# Patient Record
Sex: Female | Born: 1965 | Race: White | Hispanic: No | Smoking: Never smoker
Health system: Southern US, Academic
[De-identification: ages and names within clinical notes are randomized; demographics above are authoritative.]

## PROBLEM LIST (undated history)

## (undated) DIAGNOSIS — I10 Essential (primary) hypertension: Secondary | ICD-10-CM

## (undated) DIAGNOSIS — G4730 Sleep apnea, unspecified: Secondary | ICD-10-CM

## (undated) DIAGNOSIS — C801 Malignant (primary) neoplasm, unspecified: Secondary | ICD-10-CM

## (undated) DIAGNOSIS — E079 Disorder of thyroid, unspecified: Secondary | ICD-10-CM

## (undated) DIAGNOSIS — H9190 Unspecified hearing loss, unspecified ear: Secondary | ICD-10-CM

## (undated) DIAGNOSIS — D333 Benign neoplasm of cranial nerves: Secondary | ICD-10-CM

## (undated) DIAGNOSIS — J309 Allergic rhinitis, unspecified: Secondary | ICD-10-CM

## (undated) HISTORY — PX: HX TUBAL LIGATION: SHX77

## (undated) HISTORY — DX: Allergic rhinitis, unspecified: J30.9

## (undated) HISTORY — PX: HX OTHER: 2100001105

## (undated) HISTORY — PX: ANKLE SURGERY: SHX546

## (undated) HISTORY — DX: Sleep apnea, unspecified: G47.30

## (undated) HISTORY — DX: Unspecified hearing loss, unspecified ear: H91.90

## (undated) HISTORY — DX: Disorder of thyroid, unspecified: E07.9

## (undated) HISTORY — DX: Malignant (primary) neoplasm, unspecified: C80.1

## (undated) HISTORY — PX: LAPAROSCOPIC CHOLECYSTECTOMY: SUR755

## (undated) HISTORY — PX: SHOULDER SURGERY: SHX246

## (undated) HISTORY — DX: Benign neoplasm of cranial nerves: D33.3

## (undated) HISTORY — PX: HX TONSILLECTOMY: SHX27

## (undated) HISTORY — DX: Essential (primary) hypertension: I10

---

## 1999-10-01 ENCOUNTER — Other Ambulatory Visit (HOSPITAL_COMMUNITY): Payer: Self-pay | Admitting: OBSTETRICS/GYNECOLOGY

## 2009-02-02 ENCOUNTER — Encounter (INDEPENDENT_AMBULATORY_CARE_PROVIDER_SITE_OTHER): Payer: Self-pay | Admitting: Otolaryngology

## 2009-02-02 ENCOUNTER — Ambulatory Visit (INDEPENDENT_AMBULATORY_CARE_PROVIDER_SITE_OTHER): Payer: No Typology Code available for payment source | Admitting: Otolaryngology

## 2009-04-21 ENCOUNTER — Other Ambulatory Visit (INDEPENDENT_AMBULATORY_CARE_PROVIDER_SITE_OTHER): Payer: Self-pay | Admitting: Otolaryngology

## 2010-04-02 ENCOUNTER — Other Ambulatory Visit (INDEPENDENT_AMBULATORY_CARE_PROVIDER_SITE_OTHER): Payer: Self-pay

## 2010-04-02 ENCOUNTER — Encounter (INDEPENDENT_AMBULATORY_CARE_PROVIDER_SITE_OTHER): Payer: Self-pay | Admitting: Otolaryngology

## 2010-04-02 ENCOUNTER — Ambulatory Visit (INDEPENDENT_AMBULATORY_CARE_PROVIDER_SITE_OTHER): Payer: No Typology Code available for payment source | Admitting: Audiologist

## 2010-04-09 ENCOUNTER — Ambulatory Visit (INDEPENDENT_AMBULATORY_CARE_PROVIDER_SITE_OTHER): Payer: No Typology Code available for payment source | Admitting: Audiologist

## 2010-04-09 ENCOUNTER — Encounter (INDEPENDENT_AMBULATORY_CARE_PROVIDER_SITE_OTHER): Payer: Self-pay | Admitting: Otolaryngology

## 2010-04-09 ENCOUNTER — Ambulatory Visit (INDEPENDENT_AMBULATORY_CARE_PROVIDER_SITE_OTHER): Payer: No Typology Code available for payment source | Admitting: Otolaryngology

## 2010-04-09 ENCOUNTER — Ambulatory Visit
Admission: RE | Admit: 2010-04-09 | Discharge: 2010-04-09 | Disposition: A | Discharge status: Home / Self Care | Payer: No Typology Code available for payment source | Attending: Otolaryngology | Admitting: Otolaryngology

## 2010-04-09 MED ORDER — GADOPENTETATE DIMEGLUMINE 469.01 MG/ML (46.9 %) INTRAVENOUS SOLUTION
15.00 mL | INTRAVENOUS | Status: AC
Start: 2010-04-09 — End: 2010-04-09
  Administered 2010-04-09: 12:00:00 15 mL via INTRAVENOUS

## 2010-04-09 NOTE — Progress Notes (Signed)
PATIENT NAME:  Sandra Carlson  MRN:  [id number]  DOB:  July 01, 1966  DATE OF SERVICE: 04/09/2010    HPI:  Sandra Carlson is a 44 y.o. year old female who presents for follow-up for L acoustic neuroma, diagnosed last year. Last seen 01/2009. She has interval increase in tinnitus, and ear infections. She has ear pain with these infections. She complains of minimal unsteadiness. Today she denies fever, HA, sweats, and ear pain.     Physical Exam:  Blood pressure 140/91, pulse 88, temperature 37.3 C (99.1 F), height 1.651 m (5\' 5" ), weight 107.502 kg (237 lb).  Body mass index is 39.44 kg/(m^2).  General Appearance: Pleasant, cooperative, healthy, and in no acute distress.  Eyes: Conjunctivae/corneas clear, PERRLA, EOM's intact.  Head and Face: Normocephalic, atraumatic.  Face symmetric, no obvious lesions.   Pinnae: Normal shape and position.   External auditory canals:  Patent without inflammation.  Tympanic membranes:  Intact, translucent, mobile  Nose:  Mucosa normal  Neck:  No palpable thyroid, salivary gland, or neck masses.  Heme/Lymph:  No cervical adenopathy.  Cardiovascular:  Good perfusion of upper extremities.  No cyanosis of the hands or fingers.  Lungs: No apparent stridorous breathing. No acute distress.  Skin: Skin warm and dry.  Neurologic: Cranial nerves:  grossly intact, alert and oriented x 3, romberg negative, gait intact    Data Reviewed:  Audiogram 04/09/10 - no interval change since 01/2009, left ear 24% word recognition, right ear 100% word recognition  MRI 04/09/10- no interval change since 01/2009, 12.7 mm x  4 mm coronal 12.7 mm x 5 mm axial    Assessment:  1. Acoustic neuroma (225.1V)  MRI IAC W CONTRAST       Plan:  Orders Placed This Encounter   . MRI IAC W CONTRAST     Return in one year, MRI prior to return visit    Josefine Class, MD 04/09/2010, 2:36 PM     Gifford Shave, MD

## 2010-04-09 NOTE — Progress Notes (Signed)
AUDIOGRAM  44 y.o. / F, audio f/u per AN L observation;  Normal hearing, AD;  Normal hearing dropping sharply to severe SNHL above 1000 Hz, AS, resulting in a significant asymmetry;   Type A normal tymps, AU, consistent with normal middle ear function bilaterally;   Rec: ENT f/u, audio prn;      KJK

## 2010-04-23 ENCOUNTER — Encounter (INDEPENDENT_AMBULATORY_CARE_PROVIDER_SITE_OTHER): Payer: Self-pay | Admitting: Otolaryngology

## 2010-04-23 ENCOUNTER — Ambulatory Visit (INDEPENDENT_AMBULATORY_CARE_PROVIDER_SITE_OTHER): Payer: No Typology Code available for payment source | Admitting: Audiologist

## 2010-04-23 ENCOUNTER — Other Ambulatory Visit (INDEPENDENT_AMBULATORY_CARE_PROVIDER_SITE_OTHER): Payer: Self-pay

## 2011-04-08 ENCOUNTER — Ambulatory Visit (INDEPENDENT_AMBULATORY_CARE_PROVIDER_SITE_OTHER): Payer: No Typology Code available for payment source | Admitting: Otolaryngology

## 2011-04-08 ENCOUNTER — Ambulatory Visit (HOSPITAL_BASED_OUTPATIENT_CLINIC_OR_DEPARTMENT_OTHER): Payer: No Typology Code available for payment source | Admitting: Audiologist

## 2011-04-08 ENCOUNTER — Ambulatory Visit
Admission: RE | Admit: 2011-04-08 | Discharge: 2011-04-08 | Disposition: A | Discharge status: Home / Self Care | Payer: No Typology Code available for payment source | Source: Ambulatory Visit | Attending: Otolaryngology | Admitting: Otolaryngology

## 2011-04-08 VITALS — BP 120/74 | HR 65 | Temp 97.5°F | Ht 65.0 in | Wt 240.0 lb

## 2011-04-08 DIAGNOSIS — D333 Benign neoplasm of cranial nerves: Secondary | ICD-10-CM | POA: Insufficient documentation

## 2011-04-08 DIAGNOSIS — H905 Unspecified sensorineural hearing loss: Secondary | ICD-10-CM | POA: Insufficient documentation

## 2011-04-08 MED ORDER — GADOPENTETATE DIMEGLUMINE 469.01 MG/ML (46.9 %) INTRAVENOUS SOLUTION
20.00 mL | INTRAVENOUS | Status: AC
Start: 2011-04-08 — End: 2011-04-08
  Administered 2011-04-08: 14:00:00 20 mL via INTRAVENOUS

## 2011-04-08 NOTE — Progress Notes (Signed)
AUDIOGRAM  45 y.o. / F, f/ua duio acoustic neuroma L;  Normal hearing, AD; Slight dropping sharply to severe SNHL, AS w/ poor dscrim;  Type A normal tymps, AU, consistent with normal middle ear function bilaterally;   Rec: ENT f/u, audio prn;              KJK

## 2011-04-09 NOTE — Progress Notes (Addendum)
[facility]                              DEPARTMENT OF [place], [place] [address]                                PATIENT NAME: Sandra Carlson, Sandra Carlson ONGEXB:[id number]  DATE OF SERVICE:04/08/2011  DATE OF BIRTH: Dec 07, 1965    PROGRESS NOTE    CHIEF COMPLAINT:  Followup from April 07, 2010, for acoustic neuroma.     SUBJECTIVE:  This is a 45 year old female who returns today.  She has a history of left-sided acoustic neuroma diagnosed in 2010.  She had an MRI last year on April 09, 2010.  We measured her tumor to be 12.7 x 4 mm in coronal 12.7 x 5 mm on the axial MRI.  Today, she says she has no useful hearing in the left ear.  No significant balance issues since last year.  No significant issues with ear infections.    OBJECTIVE:  A 45 year old female in no acute distress.  She is 5 feet 5 inches tall and 240 pounds.  Temperature 97.5 degrees Fahrenheit, pulse 65, blood pressure 120/74.  General:  Pleasant, cooperative, in no distress.  Cardiovascular:  No cyanosis appreciated in the upper extremities.  Respiratory:  No respiratory distress, no apparent stridor.  Neurologic:  Romberg and tandem gait are both stable.  Ears:  Healthy tympanic membranes bilaterally.  Nose:  Moist mucosa, no epistaxis.  Oral cavity:  No concerns of the lips, tongue, gums or floor of mouth.  Oropharynx:  No concerning mass, asymmetry or lesion.  Neck:  No palpable lymphadenopathy.    REVIEW OF INFORMATION:  MRI from the internal auditory canals with and without contrast was reviewed today.  The tumor appears to have gotten slightly smaller in size.  It is a left intracanalicular tumor measuring 8 x 4 mm of coronal and 7 x 7 mm on axial.  Dr. Everlena Cooper reviewed these agreed with these measurements.     Audiogram today shows right ear hearing to be within normal limits with 100% word recognition and type A tympanogram.  The left ear shows hearing dropping from very slight hearing loss to perform wound hearing loss very sharply.  Word recognition is only 12% on this left ear.  Type A tympanogram on the left.    ASSESSMENT:  A 45 year old female with left acoustic neuroma that is slightly decreased in size on MRI and is intracanalicular.    PLAN:  We will have her follow up in 2 years with an MRI of the internal auditory canals with and without contrast.  We will not repeat the audiogram at that time as she has no serviceable hearing on the left side already.      Marnee Guarneri, MD  Resident  [place] [facility]    Gifford Shave, MD  Professor and [facility]    NU/[id number]; D: 04/08/2011 15:00:08; T: 04/09/2011 12:35:14    cc: Sherilyn Dacosta MD      [address]       [place], [place] [address]  I saw and examined the patient.  I reviewed the resident's note.  I agree with the findings and plan of care as documented in the resident's note.  Any exceptions/additions are edited/noted.    Gifford Shave, MD 04/10/2011, 9:44 AM

## 2012-09-08 ENCOUNTER — Ambulatory Visit (INDEPENDENT_AMBULATORY_CARE_PROVIDER_SITE_OTHER): Payer: Self-pay | Admitting: Otolaryngology

## 2012-09-08 NOTE — Telephone Encounter (Signed)
Message copied by Noah Delaine on Tue Sep 08, 2012  9:06 AM  ------       Message from: Debera Lat       Created: Tue Sep 08, 2012  8:54 AM         >> Debera Lat 09/08/2012 08:54 AM       Dr. Everlena Cooper pt//////////////pt states she is to be scheduled for mri and audio visit the same day she comes to see you, I have scheduled apt for 08.14.14 at 2pm please call and advise. Thank you  ------

## 2012-09-09 NOTE — Telephone Encounter (Signed)
I called and left a message stating that the patient is scheduled for appointments on 04/08/13 MRI @ 11:00 AM, Audio @ 1:00 PM, and the appointment with Everlena Cooper is at 2:00PM.

## 2013-04-08 ENCOUNTER — Encounter (INDEPENDENT_AMBULATORY_CARE_PROVIDER_SITE_OTHER): Payer: No Typology Code available for payment source | Admitting: Otolaryngology

## 2013-04-08 ENCOUNTER — Ambulatory Visit (INDEPENDENT_AMBULATORY_CARE_PROVIDER_SITE_OTHER): Payer: No Typology Code available for payment source | Admitting: Audiologist

## 2013-04-08 ENCOUNTER — Other Ambulatory Visit (INDEPENDENT_AMBULATORY_CARE_PROVIDER_SITE_OTHER): Payer: Self-pay

## 2013-04-22 ENCOUNTER — Ambulatory Visit (INDEPENDENT_AMBULATORY_CARE_PROVIDER_SITE_OTHER): Payer: No Typology Code available for payment source | Admitting: Otolaryngology-Audiology

## 2013-04-22 ENCOUNTER — Ambulatory Visit (INDEPENDENT_AMBULATORY_CARE_PROVIDER_SITE_OTHER): Payer: No Typology Code available for payment source | Admitting: Otolaryngology

## 2013-04-22 ENCOUNTER — Encounter (INDEPENDENT_AMBULATORY_CARE_PROVIDER_SITE_OTHER): Payer: Self-pay | Admitting: Otolaryngology

## 2013-04-22 ENCOUNTER — Ambulatory Visit
Admission: RE | Admit: 2013-04-22 | Discharge: 2013-04-22 | Disposition: A | Discharge status: Home / Self Care | Payer: No Typology Code available for payment source | Source: Ambulatory Visit | Attending: Otolaryngology | Admitting: Otolaryngology

## 2013-04-22 VITALS — BP 124/76 | HR 70 | Temp 97.1°F | Ht 65.75 in | Wt 241.0 lb

## 2013-04-22 DIAGNOSIS — H905 Unspecified sensorineural hearing loss: Secondary | ICD-10-CM | POA: Insufficient documentation

## 2013-04-22 MED ADMIN — gadopentetate dimeglumine 10 mmol/20 mL(469.01 mg/mL) intravenous soln: 20 mL | INTRAVENOUS | @ 13:00:00 | NDC 50419018802

## 2013-04-22 NOTE — Progress Notes (Addendum)
PATIENT NAME:  Sandra Carlson  MRN:  [id number]  DOB:  April 06, 1966  DATE OF SERVICE: 04/22/2013    HPI:  Sandra Carlson is a 47 y.o. year old female with history of left acoustic neuroma diagnosed in 2010.  She is here today for 2 years follow up.  She states that she has had no worsening of her hearing.  She states she has had no balance issues.  She states two years ago she had stable MRI.  She has no other complaints today.    Physical Exam:  Blood pressure 124/76, pulse 70, temperature 36.2 C (97.1 F), height 1.67 m (5' 5.75"), weight 109.3 kg (240 lb 15.4 oz), SpO2 97.00%.  Body mass index is 39.19 kg/(m^2).  General Appearance: Pleasant, cooperative, healthy, and in no acute distress.  Eyes: Conjunctivae/corneas clear, PERRLA, EOM's intact.  Head and Face: Normocephalic, atraumatic.  Face symmetric, no obvious lesions.   Pinnae: Normal shape and position.   External auditory canals:  Patent without inflammation.  Tympanic membranes:  Intact, translucent, midposition, middle ear aerated.  Nose:  External pyramid midline. Septum midline. Mucosa normal. No purulence, polyps, or crusts.   Oral Cavity/Oropharynx: No mucosal lesions, masses, or pharyngeal asymmetry.  Hypopharynx/Larynx: Indirect mirror laryngoscopy revealed no hypopharyngeal or laryngeal masses or lesions, normal laryngeal mobility, and voice normal.  Neck:  No palpable thyroid, salivary gland, or neck masses.  Heme/Lymph:  No cervical adenopathy.  Cardiovascular:  Good perfusion of upper extremities.  No cyanosis of the hands or fingers.  Lungs: No apparent stridorous breathing. No acute distress.  Skin: Skin warm and dry.  Neurologic: Cranial nerves:  grossly intact.  Psychiatric:  Alert and oriented x 3.    Audiogram   47y.o.f. followed by Dr. Everlena Cooper. Pt has hx of left acoustic neuroma, which was diagnosed in 2010. She continues to have serial MRI, audiogram and clinic visit with Dr. Everlena Cooper. She has no complaints today except that she cannot understand anything out of her left ear, rather she has only some sound awareness. IMP: Normal, Type A tymp right. Type Ad (hypermobile) tymp left. SRTs at 0dB right and 45dB left. WR scores are 100% right and 16% left. Responses to puretones show hearing WNL in the right ear. There is a slight to profound to severe SNHL in the left ear. No significant change in hearing since the last audiogram of 04/08/2011. REC: f/u with Dr. Everlena Cooper. Repeat audio PRN  Assessment:  Left Acoustic neuroma, appears smaller on MRI when compared to 2011, measurements today are 7x3 mm    Plan:  Discussed with patient that Acoustic neuroma appears smaller than scan from 2011.  We will plan to see her back in 2 years with MRI IAC same day or sooner if needed.    Tawni Carnes PA-C  Physician Assistant  [place] [facility]    Pt seen in clinic with Dr. Everlena Cooper     PCP:  Sherilyn Dacosta, MD  [address]  [place] [address]   REF:  Self, Referral  No address on file           I personally saw and examined the patient. See physician's assistant note for additional details. My findings are MRI scan showing no change in tumor in past 2 yrs with decrease in tumor compared with 3 yrs ago.    Gifford Shave, MD

## 2013-04-22 NOTE — Progress Notes (Signed)
AUDIOGRAM:    47y.o.f. followed by Dr. Everlena Cooper. Pt has hx of left acoustic  neuroma, which was diagnosed in 2010.  She continues to have serial MRI, audiogram and clinic visit with Dr. Everlena Cooper.  She has no complaints today except that she cannot understand anything out of her left ear, rather she has only some sound awareness.  IMP: Normal, Type A tymp right. Type Ad (hypermobile) tymp left.  SRTs at 0dB right and 45dB left.  WR scores are 100% right and 16% left.  Responses to puretones show hearing WNL in the right ear.  There is a slight to profound to severe SNHL in the left ear.   No significant change in hearing since the last audiogram of 04/08/2011.  REC: f/u with Dr. Everlena Cooper.  Repeat audio PRN.  Knox Saliva, AuD

## 2014-09-08 ENCOUNTER — Ambulatory Visit (INDEPENDENT_AMBULATORY_CARE_PROVIDER_SITE_OTHER): Payer: Self-pay | Admitting: Otolaryngology

## 2014-09-08 DIAGNOSIS — D333 Benign neoplasm of cranial nerves: Secondary | ICD-10-CM

## 2014-09-08 NOTE — Telephone Encounter (Signed)
Order placed. Patient will be contacted regarding scheduling.

## 2014-09-08 NOTE — Telephone Encounter (Signed)
-----   Message from Leitha Bleak sent at 09/08/2014  8:53 AM EST -----  >> STEPHANIE GAMBLE 09/08/2014 08:53 AM  Dr Romona Curls states that she is due in July for an audio and mri and f/u with you she would like to know if you could order these and schedule them on the same day and states that afternoon is better for her since she drives so far please call

## 2015-03-16 ENCOUNTER — Ambulatory Visit
Admission: RE | Admit: 2015-03-16 | Discharge: 2015-03-16 | Disposition: A | Discharge status: Home / Self Care | Payer: No Typology Code available for payment source | Source: Ambulatory Visit | Attending: Otolaryngology | Admitting: Otolaryngology

## 2015-03-16 ENCOUNTER — Ambulatory Visit (HOSPITAL_BASED_OUTPATIENT_CLINIC_OR_DEPARTMENT_OTHER): Payer: No Typology Code available for payment source | Admitting: Otolaryngology

## 2015-03-16 ENCOUNTER — Ambulatory Visit (INDEPENDENT_AMBULATORY_CARE_PROVIDER_SITE_OTHER): Payer: No Typology Code available for payment source | Admitting: Audiologist

## 2015-03-16 VITALS — BP 132/86 | HR 79 | Temp 97.7°F | Ht 65.75 in | Wt 252.4 lb

## 2015-03-16 DIAGNOSIS — D333 Benign neoplasm of cranial nerves: Secondary | ICD-10-CM

## 2015-03-16 DIAGNOSIS — H9192 Unspecified hearing loss, left ear: Secondary | ICD-10-CM | POA: Insufficient documentation

## 2015-03-16 LAB — PERFORM POC ISTAT CREATININE POINT OF CARE: CREATININE, POC: 0.8 mg/dL (ref 0.49–1.10)

## 2015-03-16 MED ORDER — GADOBENATE DIMEGLUMINE 529 MG/ML(0.1 MMOL/0.2 ML) INTRAVENOUS SOLUTION
20.00 mL | INTRAVENOUS | Status: AC
Start: 2015-03-16 — End: 2015-03-16
  Administered 2015-03-16: 13:00:00 20 mL via INTRAVENOUS

## 2015-03-16 NOTE — Progress Notes (Signed)
AUDIOGRAM: 49 y.o. female referred by Dr. Fonnie Mu for an audiogram.  Patient has a history of a left AN.  Impression: Type A tympanogram with normal middle ear pressure and compliance bilaterally.  Hearing is WNL, with excellent WR (100%) in the right ear.  Hearing is WNL to a profound SNHL, with no measurable word understanding in the left ear.  Recommendations: Continue medical management.  Repeat audiogram as needed. Lydia Guiles

## 2015-03-16 NOTE — Progress Notes (Signed)
PATIENT NAME:  Sandra Carlson  MRN:  [id number]  DOB:  [birth year]  DATE OF SERVICE: 03/16/2015    HPI:  Sandra Carlson is a 49 y.o. year old female who presents for follow-up  of a left-sided acoustic neuroma that we have been following since 2010, without much change in size.  In fact, the tumor may even be smaller than it was 5 years ago.  Her main symptom is that of profound hearing loss with occasional tinnitus.  No dizziness.          Physical Exam:  Blood pressure 132/86, pulse 79, temperature 36.5 C (97.7 F), temperature source Thermal Scan, height 1.67 m (5' 5.75"), weight 114.5 kg (252 lb 6.8 oz).  Body mass index is 41.06 kg/(m^2).  General Appearance: Pleasant, cooperative, healthy, and in no acute distress.  Eyes: Conjunctivae/corneas clear, PERRLA, EOM's intact.  Head and Face: Normocephalic, atraumatic.  Face symmetric, no obvious lesions.   Right Ear   Pinna: Normal shape and position.    External auditory canal:  Patent without inflammation.   Tympanic membrane:  Intact, translucent, midposition, middle ear aerated.  Left Ear   Pinna: Normal shape and position.    External auditory canal:  Patent without inflammation.   Tympanic membrane:  Intact, translucent, midposition, middle ear aerated.  Neurologic: Cranial nerves:  grossly intact. Romberg, gait, and tandem gait are normal.      Procedure:  No notes on file    Data Reviewed:  MRI with gadolinium performed today.  It has not yet been read.  I measure the tumor as being approximately 5 x 10 mm in size and still intracanalicular.  The tumor measured 5.6 x 10.6 in one dimension and 5.6 x 11 mm in another dimension in 2014.        Assessment:  1. Acoustic neuroma, intracanalicular AS, unchanged in size over last several years        Plan:  No orders of the defined types were placed in this encounter.     RTC 2 yrs with MRI with gad    Judith Part. Fonnie Mu, MD, MBA  Romeo and Jodelle Red Professor   [place] [facility] Ot[facility]    PCP: Jodean Lima,  MD  [place] [address]  REF: Eldred Manges, MD  [address]  [address]  [place], [place] [address]

## 2015-03-16 NOTE — Procedures (Signed)
AUDIOGRAM: 49 y.o. female referred by Dr. Fonnie Mu for an audiogram.  Patient has a history of a left AN.  Impression: Type A tympanogram with normal middle ear pressure and compliance bilaterally.  Hearing is WNL, with excellent WR (100%) in the right ear.  Hearing is WNL to a profound SNHL, with no measurable word understanding in the left ear.  Recommendations: Continue medical management.  Repeat audiogram as needed. Lydia Guiles

## 2016-09-20 ENCOUNTER — Ambulatory Visit (INDEPENDENT_AMBULATORY_CARE_PROVIDER_SITE_OTHER): Payer: Self-pay | Admitting: Otolaryngology

## 2016-09-20 DIAGNOSIS — D333 Benign neoplasm of cranial nerves: Secondary | ICD-10-CM

## 2016-09-20 NOTE — Telephone Encounter (Signed)
Regarding: Eldred Manges, MD  ----- Message from Shirl Harris sent at 09/19/2016  1:58 PM EST -----  Pt call to schedule appt for Return in 2 years for MRI, Audio, and visit with Dr. Fonnie Mu. I have MRI set for 02/18/17 @ 11:30am, appt with Dr. Fonnie Mu on 02/18/17 @ 1:00pm ST[facility] we also need to schedule audio. Can you have a new order placed in the chart. Please follow up with pt.    Thank you.

## 2016-09-20 NOTE — Telephone Encounter (Signed)
Pt will be returning to see Dr Fonnie Mu in June and needed an order placed to have the audio done.

## 2017-02-18 ENCOUNTER — Ambulatory Visit (INDEPENDENT_AMBULATORY_CARE_PROVIDER_SITE_OTHER): Payer: No Typology Code available for payment source | Admitting: Otolaryngology

## 2017-02-18 ENCOUNTER — Ambulatory Visit (INDEPENDENT_AMBULATORY_CARE_PROVIDER_SITE_OTHER): Payer: No Typology Code available for payment source | Admitting: Audiologist

## 2017-02-18 ENCOUNTER — Ambulatory Visit
Admission: RE | Admit: 2017-02-18 | Discharge: 2017-02-18 | Disposition: A | Discharge status: Home / Self Care | Payer: No Typology Code available for payment source | Source: Ambulatory Visit | Attending: Otolaryngology | Admitting: Otolaryngology

## 2017-02-18 VITALS — BP 126/100 | HR 98 | Temp 97.5°F | Ht 65.75 in | Wt 250.0 lb

## 2017-02-18 DIAGNOSIS — D333 Benign neoplasm of cranial nerves: Secondary | ICD-10-CM

## 2017-02-18 MED ORDER — GADOBENATE DIMEGLUMINE 529 MG/ML(0.1 MMOL/0.2 ML) INTRAVENOUS SOLUTION
20.00 mL | INTRAVENOUS | Status: AC
Start: 2017-02-18 — End: 2017-02-18
  Administered 2017-02-18: 12:00:00 20 mL via INTRAVENOUS

## 2017-02-18 NOTE — Procedures (Signed)
AUDIOGRAM  39 F, f/u L SNHL per AN;    Normal to slight high freq SNHL, AD;  Slight dropping to profound SNHL, AS with minimum discrim, stable;  Type A normal tymp, AU;     Rec: ENT f/u, audio prn;        KJK

## 2017-02-18 NOTE — Progress Notes (Signed)
PATIENT NAME:  Sandra Carlson  MRN:  [id number]  DOB:  1966-04-12  DATE OF SERVICE: 02/18/2017    HPI:  Sandra Carlson is a 51 y.o. year old female who I first saw in 2010 with a intracanalicular left sided acoustic neuroma.  Her first symptom was profound hearing loss with occasional tinnitus, and that remains her only symptom.  She denies dizziness.  She was last imaged 2 years ago, at which time the lesion measured 5 x 10 mm in the left IAC.    Physical Exam:  Blood pressure (!) 126/100, pulse 98, temperature 36.4 C (97.5 F), height 1.67 m (5' 5.75"), weight 113.4 kg (250 lb), SpO2 99 %.  Body mass index is 40.66 kg/(m^2).  General Appearance: Pleasant, cooperative, healthy, and in no acute distress.  Eyes: Conjunctivae/corneas clear, PERRLA, EOM's intact.  Head and Face: Normocephalic, atraumatic.  Face symmetric, no obvious lesions.   Right Ear   Pinna: Normal shape and position.    External auditory canal:  Patent without inflammation.   Tympanic membrane:  Intact, translucent, midposition, middle ear aerated.  Left Ear   Pinna: Normal shape and position.    External auditory canal:  Patent without inflammation.   Tympanic membrane:  Intact, translucent, midposition, middle ear aerated.    Procedure:  No notes on file    Data Reviewed:  Audiogram shows a SRT of 70 dB on the left with 12% word recognition.  This is similar to the audiogram from 03/16/15.      MRI scan performed today has not yet been read.  The gadolinium enhanced image shows a left side intracanalicular tumor measuring 5 x 10 mm.      Assessment:    (1) Left intracanalicular acoustic neuroma, unchanged in size for last 8 years.     Plan:    (1) I gave her Dr. Landis Gandy' name and suggested she return in 3 years with another MRI scan with gadolinium.  I doubt she will need surgery in the future.      Eldred Manges, MD          SDR  06.27.18  Copy to: Dr. Rubin Payor

## 2017-02-18 NOTE — Progress Notes (Signed)
AUDIOGRAM  14 F, f/u L SNHL per AN;    Normal to slight high freq SNHL, AD;  Slight dropping to profound SNHL, AS with minimum discrim, stable;  Type A normal tymp, AU;     Rec: ENT f/u, audio prn;        KJK

## 2017-02-19 ENCOUNTER — Encounter (INDEPENDENT_AMBULATORY_CARE_PROVIDER_SITE_OTHER): Payer: Self-pay | Admitting: Otolaryngology

## 2021-08-18 NOTE — Progress Notes (Signed)
 CARE TEAM:  Patient Care Team:  Vira Agar, DO as PCP - General (Family Medicine)    ASSESSMENT  1. Morton's neuroma of right foot    2. Metatarsalgia of right foot    3. Bunion, right foot       There is no problem list on file for this patient.    Impression: After discussion of symptoms with the patient and physical examination, it is under my professional opinion that the patient has right foot Morton's Neuroma, bunion deformity, and metatarsalgia.     PLAN  Treatment Plan:   Orders Placed This Encounter   . Other Ortho Injection: R 2nd interspace   . BP Patient Education    Follow-up: Return in about 6 weeks (around 10/03/2021), or if symptoms worsen or fail to improve.    The nature of the patient's condition and treatment options were discussed as described below.      Intraarticular steroid injection: I discussed with the patient that this involves the injection of a potent anti-inflammatory (cortisone) into the joint. Risks include whitening of the skin at the injection site and a transient rise in blood glucose. Complications are extremely rare; infection is the most common. There is no rule as to how many injections can be given. Because some research shows that too much cortisone can damage cartilage, most physicians limit how many injections they will give you.  Potential for future surgery: I explained that though I am not recommending a surgical intervention at this time, this may be recommended or necessary in the future to alleviate or treat this condition, especially if conservative measures fail or the condition continues to progress or worsen. The patient understands the risks and benefits and wishes to proceed with a right foot 2nd interspace steroid injection. The injection was performed today in office without complication.    MRI of the right foot was independently reviewed and discussed with the patient.  I have discussed with the patient that she has symptoms consistent with  metatarsalgia, concern for Morton's neuroma given metatarsal head tenderness with intermittent numbness and tingling, and bunion deformity. Discussed treatment options including orthotic with metatarsal recess, possible steroid injection therapy, Voltaren Gel, and wide toed shoes. Patient elected to proceed with a right foot morton's neuroma 2nd interspace steroid injection and tolerated the procedure well. Offered to provide an order for orthotics, however patient wants to wait until after the steroid injection. Patient elected to proceed with She will follow up 4-6 weeks.     HISTORY OF PRESENT ILLNESS  Chief Complaint: Pain and Swelling of the Right Foot (Pt presents today for rt foot pain, has x-ray and MRI.)     Age: 55 y.o.  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 Illescas is a 55 y.o. female  who presents today for evaluation of right foot pain.      Today, patient reports pain of the right foot with no inciting injury or event. Patient localizes pain to the plantar aspect of the right foot, with swelling. She then had x-rays performed with no specific findings and she states her MRI demonstrated possible bone bruise, possible fracture, and tendonitis. Patient indicates she has done a lot of damage to the right foot. Patient states she sometimes has significant swelling and tightness that causes some numbness and tingling symptoms of the right foot. She has tried an OTC orthotic with worsening symptoms. Patient indicates pain  is exacerbated with wearing shoes.         OBJECTIVE  Constitutional:  No acute distress. Her body mass index is 45.16 kg/m.   Eyes:  Sclera are nonicteric.  Respiratory:  No labored breathing.  Cardiovascular:  No marked edema.  Skin:  No marked skin ulcers.  Neurological:  No marked sensory loss noted.  Psychiatric: Alert and oriented x3.    Musculoskeletal     Right Foot ROM:   0 degrees dorsiflexion   Right Great Toe MTP ROM:  Limited  Right Great Toe IP ROM: Normal   Skin:  Right Foot: skin intact, no rashes or lesions.   Inspection:  Heel corrects to varus on heel rise  NTTP over posterior tibia  NTTP over plantar fascia  Pain and tenderness between 1st and 2nd metatarsal heads  Slight bunion deformity  Right Foot Eversion: Strength: 5/5, normal muscle tone.   Inversion: Strength: 5/5, normal muscle tone.   Stability:  Right Foot: Stable   Special:  Right Foot: Normal           IMAGING / STUDIES           Xrays of the right foot were taken at [facility] on 08/01/21 and independently reviewed by me today    Impression:  No obvious fracture identified. Normal alignment. Normal joint space. Normal bony architecture. No lytic or blastic lesions. No significant soft tissue swelling or effusion. Skeletally mature.      An MRI of the right foot was obtained at an external facility on 07/15/21 and independently reviewed by me today.     Impression: dorsal soft tissue edema. Otherwise normal. No obvious fracture identified.         PROCEDURES  Other Ortho Injection: R 2nd interspace    Date/Time: 08/22/2021 1:35 PM  Performed by: Terri Piedra, DO  Authorized by: Terri Piedra, DO     Procedure Details:  Procedure: Morton's Neuroma  Consent Given by:  Patient  Site marked: the procedure site was marked    Timeout: prior to procedure the correct patient, procedure, and site was verified    Indications:  Pain    Site:  R 2nd interspace        Medication Details:  Needle Size:  25    Preparation: Patient was prepped and draped in the usual sterile fashion.  Medications administered: 6 mg Betamethasone 6 (3-3) MG/ML; 2 mL bupivacaine 0.25 %    Patient education: Cortisone Flare Risk Discussed, Patient Education Distributed, Skin or Facial Flushing Discussed and Changes in Pigmentation Discussed  Patient tolerance: patient tolerated the procedure well with no immediate complications      I, Terri Piedra, DO, personally, performed the services described in  this documentation, as scribed in my presence, and it is both accurate and complete.  Scribed by: Lysbeth Penner

## 2021-11-29 ENCOUNTER — Other Ambulatory Visit: Payer: Self-pay

## 2021-11-29 ENCOUNTER — Other Ambulatory Visit: Payer: 59 | Attending: FAMILY PRACTICE

## 2021-11-29 DIAGNOSIS — K219 Gastro-esophageal reflux disease without esophagitis: Secondary | ICD-10-CM | POA: Insufficient documentation

## 2021-11-29 DIAGNOSIS — F411 Generalized anxiety disorder: Secondary | ICD-10-CM | POA: Insufficient documentation

## 2021-11-29 DIAGNOSIS — D509 Iron deficiency anemia, unspecified: Secondary | ICD-10-CM | POA: Insufficient documentation

## 2021-11-29 DIAGNOSIS — E039 Hypothyroidism, unspecified: Secondary | ICD-10-CM | POA: Insufficient documentation

## 2021-11-29 DIAGNOSIS — I10 Essential (primary) hypertension: Secondary | ICD-10-CM | POA: Insufficient documentation

## 2021-11-29 LAB — COMPREHENSIVE METABOLIC PANEL, NON-FASTING
ALBUMIN/GLOBULIN RATIO: 1.4 (ref 0.8–1.4)
ALBUMIN: 4.1 g/dL (ref 3.5–5.7)
ALKALINE PHOSPHATASE: 83 U/L (ref 34–104)
ALT (SGPT): 29 U/L (ref 7–52)
ANION GAP: 6 mmol/L — ABNORMAL LOW (ref 10–20)
AST (SGOT): 25 U/L (ref 13–39)
BILIRUBIN TOTAL: 0.6 mg/dL (ref 0.3–1.2)
BUN/CREA RATIO: 20 (ref 6–22)
BUN: 19 mg/dL (ref 7–25)
CALCIUM, CORRECTED: 9 mg/dL (ref 8.9–10.8)
CALCIUM: 9.1 mg/dL (ref 8.6–10.3)
CHLORIDE: 108 mmol/L — ABNORMAL HIGH (ref 98–107)
CO2 TOTAL: 26 mmol/L (ref 21–31)
CREATININE: 0.97 mg/dL (ref 0.60–1.30)
ESTIMATED GFR: 69 mL/min/{1.73_m2} (ref >59)
GLOBULIN: 2.9 (ref 2.9–5.4)
GLUCOSE: 94 mg/dL (ref 74–109)
OSMOLALITY, CALCULATED: 282 mOsm/kg (ref 270–290)
POTASSIUM: 4.2 mmol/L (ref 3.5–5.1)
PROTEIN TOTAL: 7 g/dL (ref 6.4–8.9)
SODIUM: 140 mmol/L (ref 136–145)

## 2021-11-29 LAB — LIPID PANEL
CHOL/HDL RATIO: 3.8
CHOLESTEROL: 180 mg/dL (ref 136–290)
HDL CHOL: 47 mg/dL (ref 23–92)
LDL CALC: 107 mg/dL — ABNORMAL HIGH (ref 0–100)
TRIGLYCERIDES: 128 mg/dL (ref <=150)
VLDL CALC: 26 mg/dL (ref 0–50)

## 2021-11-29 LAB — CBC WITH DIFF
BASOPHIL #: 0.1 10*3/uL (ref 0.00–2.50)
BASOPHIL %: 1 % (ref 0–3)
EOSINOPHIL #: 0.2 10*3/uL (ref 0.00–2.40)
EOSINOPHIL %: 4 % (ref 0–7)
HCT: 44.1 % (ref 37.0–47.0)
HGB: 15.4 g/dL (ref 12.5–16.0)
LYMPHOCYTE #: 1.5 10*3/uL — ABNORMAL LOW (ref 2.10–11.00)
LYMPHOCYTE %: 27 % (ref 25–45)
MCH: 29.8 pg (ref 27.0–32.0)
MCHC: 35 g/dL (ref 32.0–36.0)
MCV: 85.1 fL (ref 78.0–99.0)
MONOCYTE #: 0.4 10*3/uL (ref 0.00–4.10)
MONOCYTE %: 7 % (ref 0–12)
MPV: 8.4 fL (ref 7.4–10.4)
NEUTROPHIL #: 3.5 10*3/uL — ABNORMAL LOW (ref 4.10–29.00)
NEUTROPHIL %: 61 % (ref 40–76)
PLATELETS: 214 10*3/uL (ref 140–440)
RBC: 5.19 10*6/uL (ref 4.20–5.40)
RDW: 13.8 % (ref 11.6–14.8)
WBC: 5.7 10*3/uL (ref 4.0–10.5)
WBCS UNCORRECTED: 5.7 10*3/uL

## 2021-11-29 LAB — THYROID STIMULATING HORMONE (SENSITIVE TSH): TSH: 2.903 u[IU]/mL (ref 0.450–5.330)

## 2021-11-29 LAB — IRON TRANSFERRIN AND TIBC
IRON (TRANSFERRIN) SATURATION: 50 % (ref 15–50)
IRON: 147 ug/dL (ref 50–212)
TOTAL IRON BINDING CAPACITY: 293 ug/dL (ref 250–450)
TRANSFERRIN: 209 mg/dL (ref 203–362)
UIBC: 146 ug/dL (ref 130–375)

## 2021-11-29 LAB — HGA1C (HEMOGLOBIN A1C WITH EST AVG GLUCOSE): HEMOGLOBIN A1C: 5.5 % (ref 4.0–6.0)

## 2022-01-28 ENCOUNTER — Encounter (INDEPENDENT_AMBULATORY_CARE_PROVIDER_SITE_OTHER): Payer: Self-pay | Admitting: OTOLARYNGOLOGY

## 2022-01-29 IMAGING — MR MRI BRAIN W/CONTRAST
10 of 12 series · 35 of 48 positions shown · IV contrast (gadavist)
Comparison: None available.

﻿EXAM:  MRI BRAIN W/CONTRAST
INDICATION: Chronic left-sided hearing loss. History of vestibular schwannoma.
TECHNIQUE: Multiplanar multisequential MRI of the brain and internal auditory canals was performed without and with 5 mL of Gadavist.

[Series 6: DWI · axial · 5.0mm · 1.35mm/px · z∈[-11,+115]mm · 9 of 88 slices shown (1 of 3)]
[im 1/88]
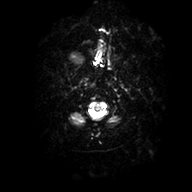
[im 16/88]
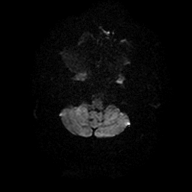
[im 24/88]
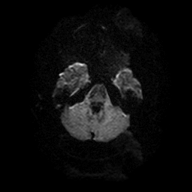
[im 40/88]
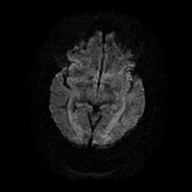
[im 48/88]
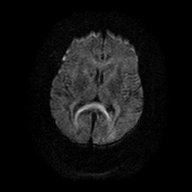
[im 64/88]
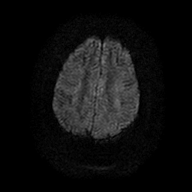
[im 72/88]
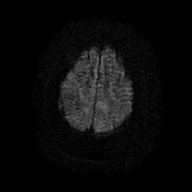
[im 80/88]
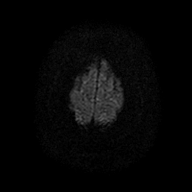
[im 88/88]
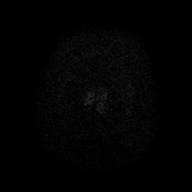

[Series 7: DWI · axial · 5.0mm · 1.35mm/px · z∈[-11,+115]mm · 4 of 22 slices shown (2 of 3)]
[im 1/22]
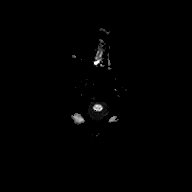
[im 8/22]
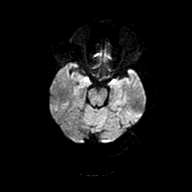
[im 15/22]
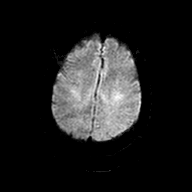
[im 22/22]
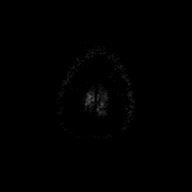

[Series 8: DWI · axial · 5.0mm · 1.35mm/px · z∈[-11,+115]mm · 3 of 22 slices shown (3 of 3)]
[im 1/22]
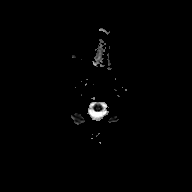
[im 11/22]
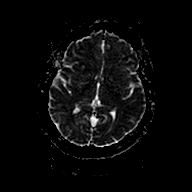
[im 22/22]
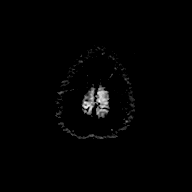

[Series 9: FLAIR · sagittal · 4.0mm · 0.75mm/px · 3 of 26 slices shown (1 of 2)]
[im 1/26]
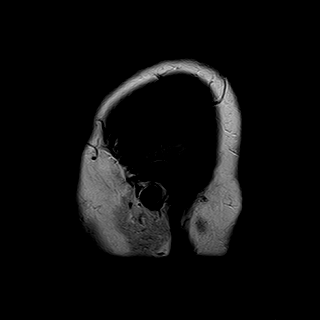
[im 13/26]
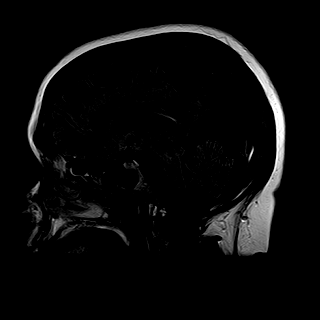
[im 26/26]
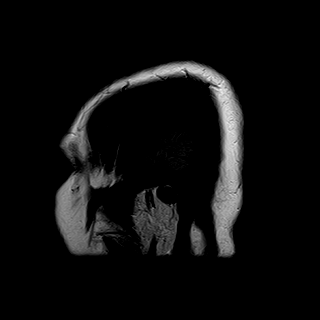

[Series 10: T2 · axial · 4.0mm · 0.43mm/px · z∈[-15,+125]mm · 4 of 36 slices shown]
[im 1/36]
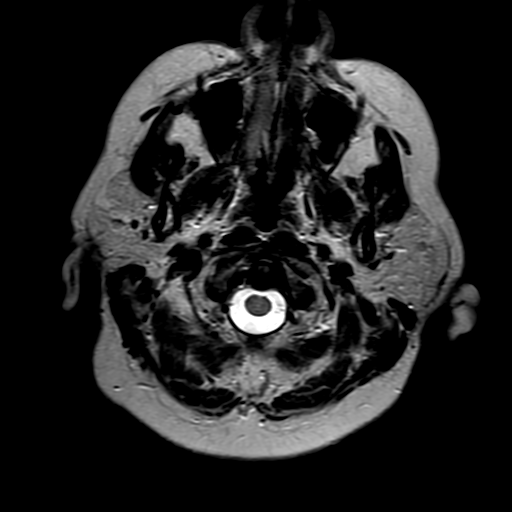
[im 12/36]
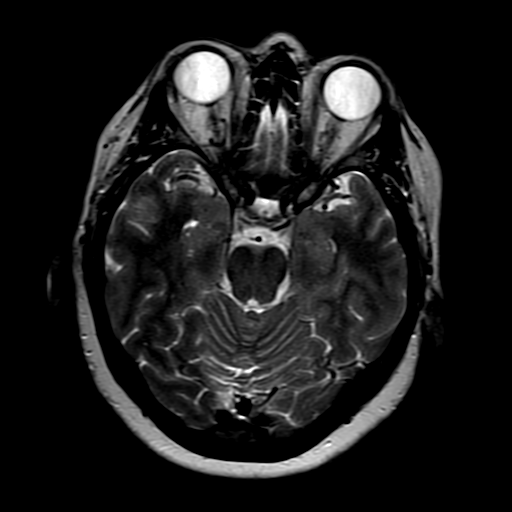
[im 24/36]
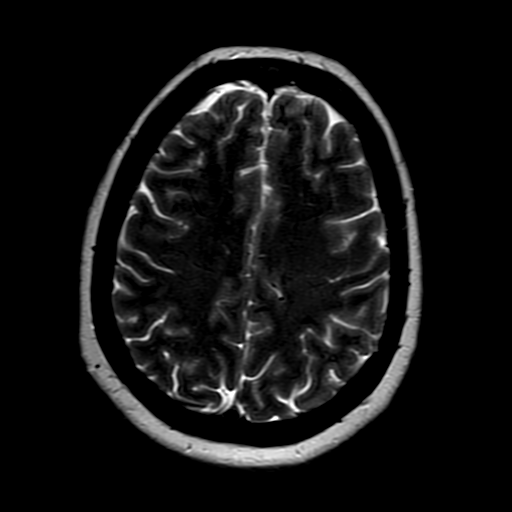
[im 36/36]
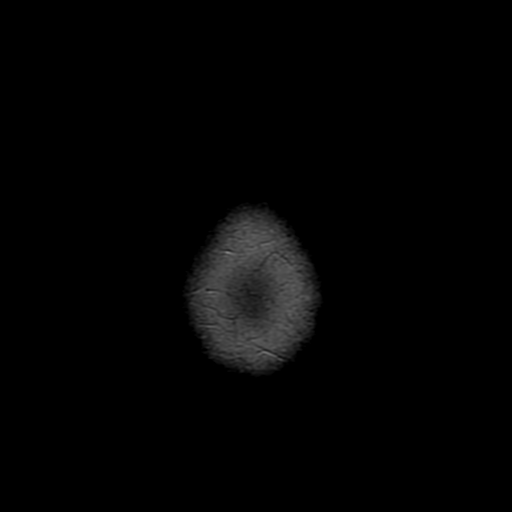

[Series 11: FLAIR · axial · 4.0mm · 0.43mm/px · z∈[-15,+125]mm · 4 of 36 slices shown (2 of 2)]
[im 1/36]
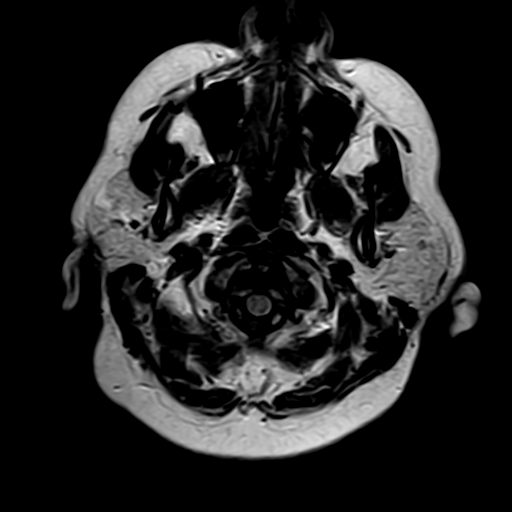
[im 12/36]
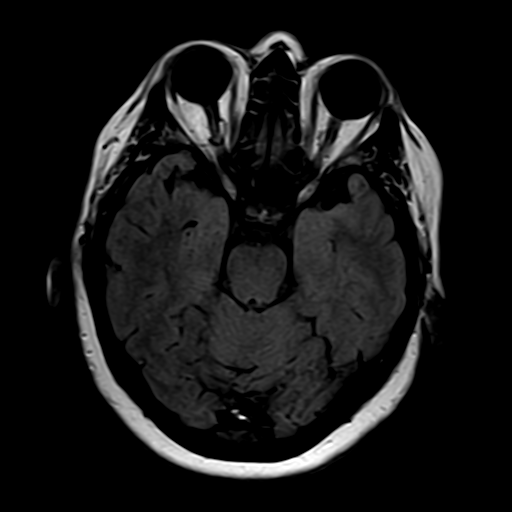
[im 24/36]
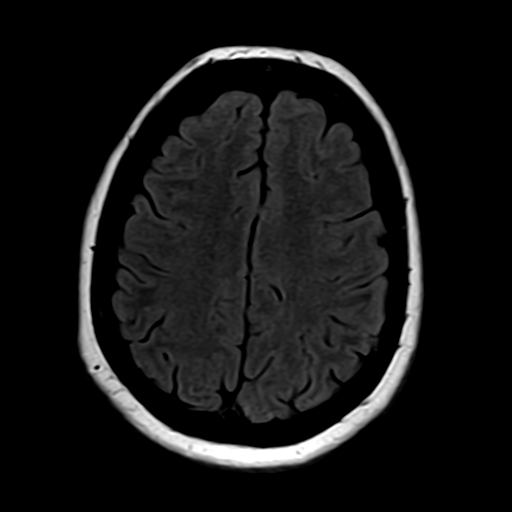
[im 36/36]
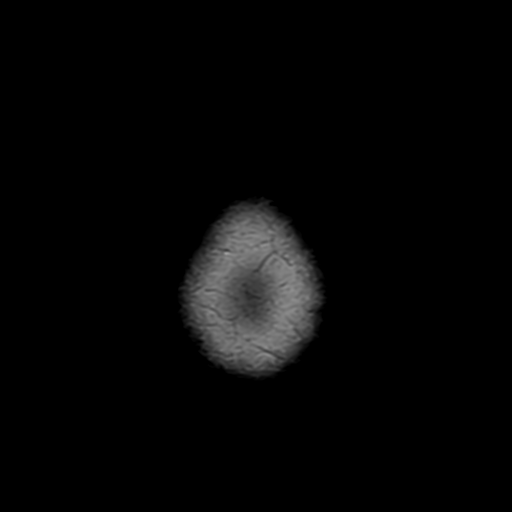

[Series 12: T1 · axial · 4.0mm · 0.43mm/px · z∈[-15,+29]mm · 2 of 36 slices shown]
[im 1/36]
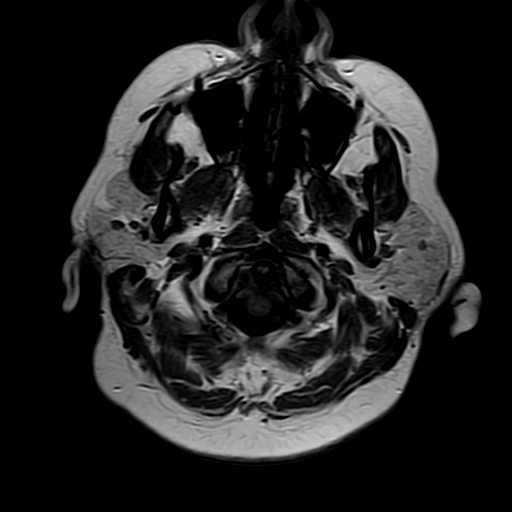
[im 12/36]
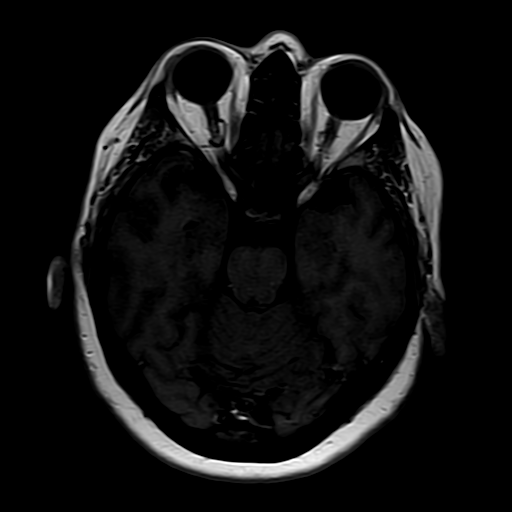

[Series 15: T1 post-contrast · axial · 3.0mm · 0.47mm/px · 1 of 11 slices shown (1 of 3)]
[im 1/11]
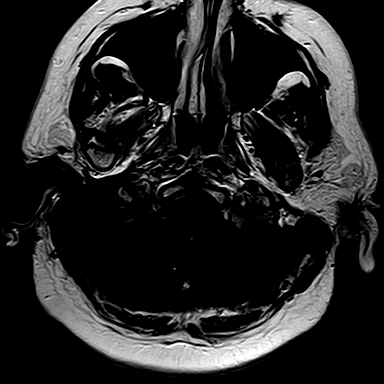

[Series 16: T1 post-contrast · axial · 4.0mm · 0.43mm/px · z∈[-15,+125]mm · 4 of 36 slices shown (2 of 3)]
[im 1/36]
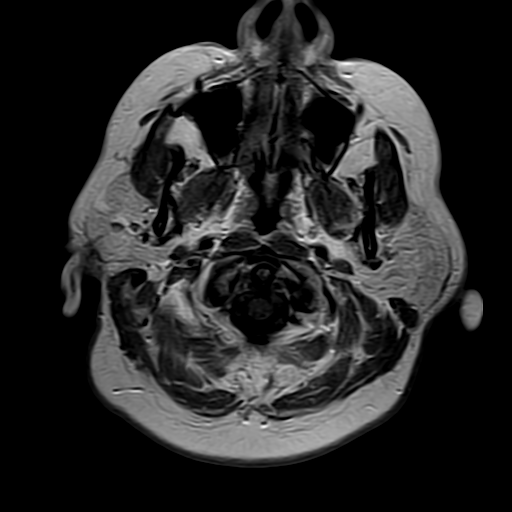
[im 12/36]
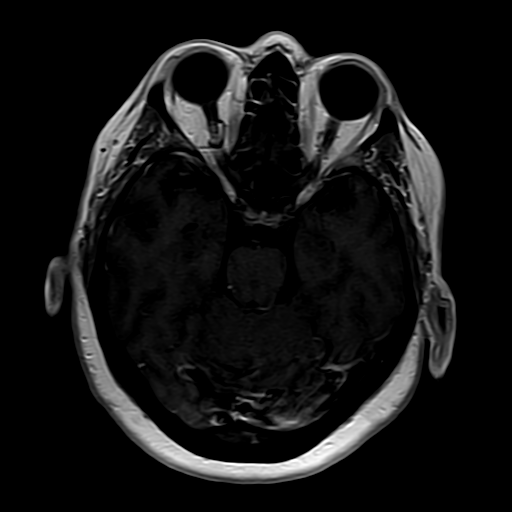
[im 24/36]
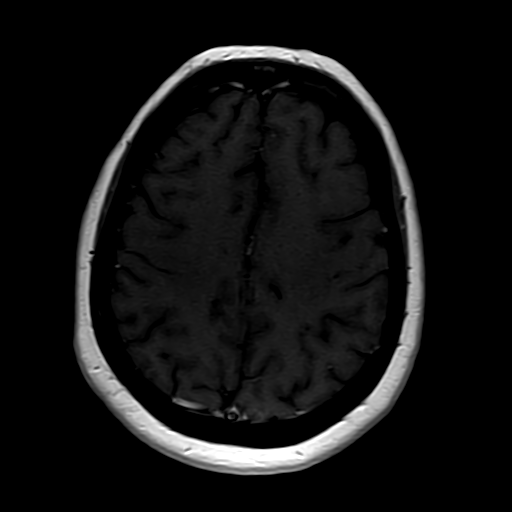
[im 36/36]
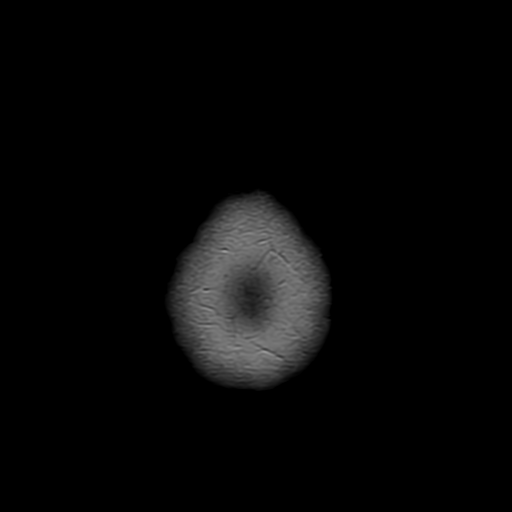

[Series 17: T1 post-contrast · coronal · 3.0mm · 0.47mm/px · 1 of 11 slices shown (3 of 3)]
[im 1/11]
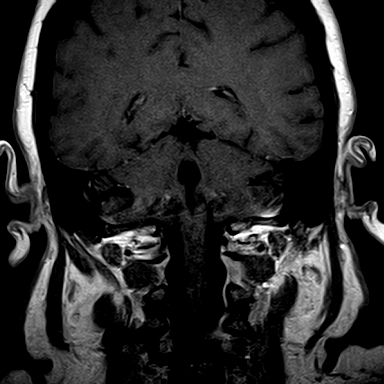

[35 of 48 positions shown; findings below may reference images not displayed]

FINDINGS: Ventricular and sulcal size is normal for the patient's age. There are minimal chronic small vessel ischemic changes. There is no mass effect, midline shift or intracranial hemorrhage. There is no evidence of acute infarction. Skull base flow voids and basal cisterns are patent.  Sagittal survey of midline structures is unremarkable. Following intravenous contrast administration, there is no abnormal parenchymal or leptomeningeal enhancement.  There is a 9 x 4.5 mm left vestibular schwannoma.  There are no extra-axial fluid collections. Visualized paranasal sinuses, mastoid air cells and orbital contents are unremarkable.
IMPRESSION: 1. A 9 x 4.5 mm left vestibular schwannoma.  

 2. No acute intracranial abnormality.

## 2022-02-06 ENCOUNTER — Encounter (INDEPENDENT_AMBULATORY_CARE_PROVIDER_SITE_OTHER): Payer: Self-pay | Admitting: OTOLARYNGOLOGY

## 2022-03-13 ENCOUNTER — Other Ambulatory Visit: Payer: 59 | Attending: FAMILY PRACTICE

## 2022-03-13 ENCOUNTER — Other Ambulatory Visit: Payer: Self-pay

## 2022-03-13 DIAGNOSIS — R197 Diarrhea, unspecified: Secondary | ICD-10-CM | POA: Insufficient documentation

## 2022-03-13 LAB — C. DIFFICILE PCR
C. DIFFICILE TOXIN GENE, PCR: NEGATIVE
PRESUMPTIVE 027/NAP1/BI: NEGATIVE

## 2022-03-14 LAB — GI PANEL BY BIOFIRE FILM ARRAY (RUBY)
ADENOVIRUS F 40/41: NOT DETECTED
ASTROVIRUS: NOT DETECTED
CAMPYLOBACTER: NOT DETECTED
CRYPTOSPORIDIUM: NOT DETECTED
CYCLOSPORA CAYETANENSIS: NOT DETECTED
ENTAMOEBA HISTOLYTICA: NOT DETECTED
ENTEROAGGREGATIVE E. COLI (EAEC): NOT DETECTED
ENTEROPATHOGENIC E COLI (EPEC): DETECTED — AB
ENTEROTOXIGENIC E COLI (ETEC) LT/ST: NOT DETECTED
GIARDIA LAMBLIA: NOT DETECTED
NOROVIRUS GI/GII: NOT DETECTED
PLESIOMONAS SHIGELLOIDES: NOT DETECTED
ROTAVIRUS A: NOT DETECTED
SAPOVIRUS: NOT DETECTED
SHIGA-LIKE TOXIN-PRODUCING E COLI (STEC) STX1/STX2: NOT DETECTED
SHIGELLA/ENTEROINVASIVE E COLI (EIEC): NOT DETECTED
VIBRIO CHOLERAE: NOT DETECTED
VIBRIO: NOT DETECTED
YERSINIA ENTEROCOLITICA: NOT DETECTED

## 2022-03-14 LAB — GI PANEL BY BIOFIRE FILM ARRAY: SALMONELLA SPECIES: NOT DETECTED

## 2022-03-14 LAB — OVA AND PARASITE SCREEN: OVA & PARASITE SCREEN TRICHROME: NONE SEEN

## 2022-03-18 ENCOUNTER — Other Ambulatory Visit (HOSPITAL_COMMUNITY): Payer: Self-pay | Admitting: FAMILY PRACTICE

## 2022-03-18 DIAGNOSIS — Z1231 Encounter for screening mammogram for malignant neoplasm of breast: Secondary | ICD-10-CM

## 2022-03-18 LAB — HELICOBACTER PYLORI AG, BY EIA: H. PYLORI ANTIGEN: NOT DETECTED

## 2022-03-25 ENCOUNTER — Ambulatory Visit (HOSPITAL_COMMUNITY): Payer: Self-pay

## 2022-03-25 ENCOUNTER — Other Ambulatory Visit (HOSPITAL_COMMUNITY): Payer: Self-pay | Admitting: FAMILY PRACTICE

## 2022-03-25 DIAGNOSIS — Z780 Asymptomatic menopausal state: Secondary | ICD-10-CM

## 2022-03-28 ENCOUNTER — Inpatient Hospital Stay
Admission: RE | Admit: 2022-03-28 | Discharge: 2022-03-28 | Disposition: A | Discharge status: Home / Self Care | Payer: 59 | Source: Ambulatory Visit | Attending: FAMILY PRACTICE | Admitting: FAMILY PRACTICE

## 2022-03-28 ENCOUNTER — Encounter (HOSPITAL_COMMUNITY): Payer: Self-pay

## 2022-03-28 ENCOUNTER — Other Ambulatory Visit: Payer: Self-pay

## 2022-03-28 DIAGNOSIS — Z1231 Encounter for screening mammogram for malignant neoplasm of breast: Secondary | ICD-10-CM | POA: Insufficient documentation

## 2022-04-04 ENCOUNTER — Ambulatory Visit (HOSPITAL_COMMUNITY): Payer: Self-pay

## 2022-04-15 ENCOUNTER — Encounter (INDEPENDENT_AMBULATORY_CARE_PROVIDER_SITE_OTHER): Payer: 59 | Admitting: OTOLARYNGOLOGY

## 2022-05-16 ENCOUNTER — Other Ambulatory Visit: Payer: Self-pay

## 2022-05-16 ENCOUNTER — Other Ambulatory Visit: Payer: 59 | Attending: FAMILY PRACTICE

## 2022-05-16 DIAGNOSIS — E785 Hyperlipidemia, unspecified: Secondary | ICD-10-CM | POA: Insufficient documentation

## 2022-05-16 DIAGNOSIS — F411 Generalized anxiety disorder: Secondary | ICD-10-CM | POA: Insufficient documentation

## 2022-05-16 DIAGNOSIS — E039 Hypothyroidism, unspecified: Secondary | ICD-10-CM | POA: Insufficient documentation

## 2022-05-16 DIAGNOSIS — I10 Essential (primary) hypertension: Secondary | ICD-10-CM | POA: Insufficient documentation

## 2022-05-16 LAB — CBC WITH DIFF
BASOPHIL #: 0.1 10*3/uL (ref 0.00–0.10)
BASOPHIL %: 2 % — ABNORMAL HIGH (ref 0–1)
EOSINOPHIL #: 0.3 10*3/uL (ref 0.00–0.50)
EOSINOPHIL %: 5 %
HCT: 44.6 % — ABNORMAL HIGH (ref 31.2–41.9)
HGB: 15.2 g/dL — ABNORMAL HIGH (ref 10.9–14.3)
LYMPHOCYTE #: 1.6 10*3/uL (ref 1.00–3.00)
LYMPHOCYTE %: 28 % (ref 16–44)
MCH: 29.5 pg (ref 24.7–32.8)
MCHC: 34.1 g/dL (ref 32.3–35.6)
MCV: 86.5 fL (ref 75.5–95.3)
MONOCYTE #: 0.4 10*3/uL (ref 0.30–1.00)
MONOCYTE %: 7 % (ref 5–13)
MPV: 8.2 fL (ref 7.9–10.8)
NEUTROPHIL #: 3.4 10*3/uL (ref 1.85–7.80)
NEUTROPHIL %: 59 % (ref 43–77)
PLATELETS: 223 10*3/uL (ref 140–440)
RBC: 5.16 10*6/uL — ABNORMAL HIGH (ref 3.63–4.92)
RDW: 13.9 % (ref 12.3–17.7)
WBC: 5.7 10*3/uL (ref 3.8–11.8)

## 2022-05-16 LAB — BASIC METABOLIC PANEL
ANION GAP: 7 mmol/L (ref 4–13)
BUN/CREA RATIO: 18 (ref 6–22)
BUN: 17 mg/dL (ref 7–25)
CALCIUM: 9 mg/dL (ref 8.6–10.3)
CHLORIDE: 110 mmol/L — ABNORMAL HIGH (ref 98–107)
CO2 TOTAL: 25 mmol/L (ref 21–31)
CREATININE: 0.95 mg/dL (ref 0.60–1.30)
ESTIMATED GFR: 70 mL/min/{1.73_m2} (ref >59)
GLUCOSE: 103 mg/dL (ref 74–109)
OSMOLALITY, CALCULATED: 285 mOsm/kg (ref 270–290)
POTASSIUM: 4.4 mmol/L (ref 3.5–5.1)
SODIUM: 142 mmol/L (ref 136–145)

## 2022-05-16 LAB — HEPATIC FUNCTION PANEL
ALBUMIN/GLOBULIN RATIO: 1.4 (ref 0.8–1.4)
ALBUMIN: 4 g/dL (ref 3.5–5.7)
ALKALINE PHOSPHATASE: 79 U/L (ref 34–104)
ALT (SGPT): 32 U/L (ref 7–52)
AST (SGOT): 23 U/L (ref 13–39)
BILIRUBIN DIRECT: 0.12 md/dL (ref <=0.20)
BILIRUBIN TOTAL: 0.6 mg/dL (ref 0.3–1.2)
BILIRUBIN, INDIRECT: 0.48 mg/dL (ref <=1)
GLOBULIN: 2.8 — ABNORMAL LOW (ref 2.9–5.4)
PROTEIN TOTAL: 6.8 g/dL (ref 6.4–8.9)

## 2022-05-16 LAB — THYROID STIMULATING HORMONE (SENSITIVE TSH): TSH: 3.553 u[IU]/mL (ref 0.450–5.330)

## 2022-05-24 ENCOUNTER — Other Ambulatory Visit: Payer: Self-pay

## 2022-05-24 ENCOUNTER — Other Ambulatory Visit: Payer: 59 | Attending: FAMILY PRACTICE

## 2022-05-24 DIAGNOSIS — D582 Other hemoglobinopathies: Secondary | ICD-10-CM | POA: Insufficient documentation

## 2022-05-24 LAB — IRON TRANSFERRIN AND TIBC
IRON (TRANSFERRIN) SATURATION: 31 % (ref 15–50)
IRON: 87 ug/dL (ref 50–212)
TOTAL IRON BINDING CAPACITY: 280 ug/dL (ref 250–450)
TRANSFERRIN: 200 mg/dL — ABNORMAL LOW (ref 203–362)
UIBC: 193 ug/dL (ref 130–375)

## 2022-06-05 ENCOUNTER — Encounter (INDEPENDENT_AMBULATORY_CARE_PROVIDER_SITE_OTHER): Payer: Self-pay | Admitting: OTOLARYNGOLOGY

## 2022-06-05 ENCOUNTER — Ambulatory Visit (INDEPENDENT_AMBULATORY_CARE_PROVIDER_SITE_OTHER): Payer: 59 | Admitting: OTOLARYNGOLOGY

## 2022-06-05 ENCOUNTER — Other Ambulatory Visit: Payer: Self-pay

## 2022-06-05 VITALS — Wt 275.0 lb

## 2022-06-05 DIAGNOSIS — D333 Benign neoplasm of cranial nerves: Secondary | ICD-10-CM

## 2022-06-05 DIAGNOSIS — H903 Sensorineural hearing loss, bilateral: Secondary | ICD-10-CM

## 2022-06-05 DIAGNOSIS — R09A2 Foreign body sensation, throat: Secondary | ICD-10-CM

## 2022-06-05 DIAGNOSIS — K219 Gastro-esophageal reflux disease without esophagitis: Secondary | ICD-10-CM

## 2022-06-05 NOTE — Procedures (Signed)
ENT, [place]  [place] [address]    Procedure Note    Name: Sandra Carlson MRN:  [id number]   Date: 06/05/2022 Age: 56 y.o.  DOB:   [birth year]       31575 - LARYNGOSCOPY, FLEXIBLE DIAGNOSTIC (AMB ONLY)    Performed by: Gillermo Murdoch, DO  Authorized by: Gillermo Murdoch, DO    Time Out:     Immediately before the procedure, a time out was called:  Yes    Patient verified:  Yes    Procedure Verified:  Yes    Site Verified:  Yes    Indications for procedure: Unable to fully visualize laryngeal anatomy on indirect laryngoscopy, Globus sensation, and GERD / LPR management    Anesthesia: Oxymetazoline nasal spray    Description: The flexible endoscope was gently introduced into the nostril and passed along the floor of the nose to the nasopharynx. Adenoid was minimal and eustachian tubes normal. The retropalatal airway was patent.    The endoscope was passed to the oropharynx. Base of tongue displayed normal lingual tonsils, patent valelulla, and sharply defined upright epiglottis. Retrolingual airway was patent.    The larynx displayed normal true vocal cords with good mobility. False cords were normal. Arytenoid mucosa was pink with no edema.     The piriform recesses were symmetric without secretion. The hypopharynx was symmetric without lesion.    Findings: Laryngopharyngeal Reflux and Symmetrical true vocal fold motion    The patient tolerated the procedure well.            Gillermo Murdoch, DO

## 2022-06-05 NOTE — H&P (Signed)
ENT, [place]  [place] [address]  Phone: [phone]  Fax: [phone]      Encounter Date: 06/05/2022    Patient ID: Sandra Carlson  MRN: [id number]    DOB: [birth year]  Age: 56 y.o. female     Progress Note       Referring Provider:  Garnette Scheuermann, DO    Reason for Visit:   Chief Complaint   Patient presents with    Hearing Loss     Patient here for 1 year follow up on head. MRI done at [facility].         History of Present Illness:  Sandra Carlson is a 56 y.o. female who is FU on LPR, acoustic neuroma, here to review MRI results. She has no complaints.  Mild increasing globus, throat clearing.  Currently taking Prilosec daily.  Patient reports she snores is unsure if that is impacting her throat.    Brain MRI showed 65m x 4.53mL vestibular schwannoma, this is stable compared to the prior MRI done in 2018    Most recent audiogram reviewed and stable from prior    Patient History:  Patient Active Problem List   Diagnosis    Acoustic neuroma (CMS HCC)    Hearing loss in left ear    Hypothyroidism     Current Outpatient Medications   Medication Sig    escitalopram oxalate (LEXAPRO) 10 mg Oral Tablet Take 10 mg by mouth Once a day    FEROSUL 325 mg (65 mg iron) Oral Tablet     furosemide (LASIX) 20 mg Oral Tablet     levothyroxine (SYNTHROID) 25 mcg Tab take 25 mcg by mouth Once a day.    omeprazole (PRILOSEC) 20 mg Oral Capsule, Delayed Release(E.C.)     potassium chloride (K-DUR) 10 mEq Oral Tab Sust.Rel. Particle/Crystal     valsartan (DIOVAN) 320 mg Oral Tablet       Allergies   Allergen Reactions    Augmentin [Amoxicillin-Pot Clavulanate]     Clindamycin     Quinolones      Other reaction(s): Chest Pain     Past Medical History:   Diagnosis Date    Acoustic neuroma (CMS HCC)     Allergic rhinitis     Cancer (CMS HCC)     SKIN    Essential hypertension     Hearing loss     Sleep apnea     Thyroid disease      Past Surgical History:   Procedure Laterality Date    ANKLE SURGERY      HX  OTHER      HX TONSILLECTOMY      HX TUBAL LIGATION      LAPAROSCOPIC CHOLECYSTECTOMY       Family Medical History:       Problem Relation (Age of Onset)    Breast Cancer Paternal Grandmother    Diabetes Father    Hypertension (High Blood Pressure) Father    No Known Problems Mother, Sister, Brother, Maternal Grandmother, Maternal Grandfather, Paternal Gr21Daughter, Son, Maternal Au33Maternal Uncle, Paternal Au51Paternal Uncle, Other            Social History     Tobacco Use    Smoking status: Never   Substance Use Topics    Alcohol use: No       Review of Systems:  Review of Systems    Physical Exam:  Wt 125 kg (275 lb)  BMI 44.72 kg/m       Physical Exam  Constitutional:       Appearance: Normal appearance. She is well-developed, well-groomed and normal weight.   HENT:      Head: Normocephalic and atraumatic.      Right Ear: Hearing, tympanic membrane, ear canal and external ear normal.      Left Ear: Hearing, tympanic membrane, ear canal and external ear normal.      Nose: Septal deviation and mucosal edema present.      Right Turbinates: Enlarged.      Left Turbinates: Enlarged.      Mouth/Throat:      Lips: Pink.      Mouth: Mucous membranes are moist.      Pharynx: Oropharynx is clear. Uvula midline.   Eyes:      Extraocular Movements: Extraocular movements intact.   Neck:      Trachea: Phonation normal.   Pulmonary:      Effort: Pulmonary effort is normal.   Musculoskeletal:      Cervical back: Normal range of motion and neck supple.   Lymphadenopathy:      Cervical: No cervical adenopathy.   Skin:     General: Skin is warm.   Neurological:      Mental Status: She is alert and oriented to person, place, and time.      Cranial Nerves: Cranial nerves 2-12 are intact. No facial asymmetry.   Psychiatric:         Attention and Perception: Attention normal.         Mood and Affect: Mood normal.         Speech: Speech normal.         Behavior: Behavior normal. Behavior is cooperative.          Assessment:  ENCOUNTER DIAGNOSES     ICD-10-CM   1. Acoustic neuroma (CMS HCC)  D33.3   2. Laryngopharyngeal reflux (LPR)  K21.9   3. Globus syndrome  R09.A2   4. 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  Reviewed Brain MRI showing no change in L acoustic neuroma.  Recommend repeat if any symptoms of enlargement/growth (i.e. change in hearing/balance issues/facial weakness) or as a routine evaluation in 3-5 years  Audiogram reviewed and stable compared to prior  Continue Prilosec daily  Reflux precautions/diet modifications  Recommend sleep medicine eval/PSG.  Patient wishes to observe for symptoms at this time  Orders Placed This Encounter    31575 - LARYNGOSCOPY, FLEXIBLE DIAGNOSTIC (AMB ONLY)     Follow-up in 1 year or as needed    The advanced practice clinician's documentation was reviewed/amended in its entirety with the assessment and plan portion completely performed independently by me during this encounter.    Gillermo Murdoch, DO      Mathis Dad, PA-C  06/05/2022, 14:33

## 2022-07-26 ENCOUNTER — Other Ambulatory Visit: Payer: Self-pay

## 2022-07-26 ENCOUNTER — Other Ambulatory Visit: Payer: 59 | Attending: FAMILY PRACTICE

## 2022-07-26 DIAGNOSIS — D582 Other hemoglobinopathies: Secondary | ICD-10-CM | POA: Insufficient documentation

## 2022-07-26 LAB — CBC WITH DIFF
BASOPHIL #: 0.1 10*3/uL (ref 0.00–0.10)
BASOPHIL %: 1 % (ref 0–1)
EOSINOPHIL #: 0.2 10*3/uL (ref 0.00–0.50)
EOSINOPHIL %: 3 %
HCT: 47.1 % — ABNORMAL HIGH (ref 31.2–41.9)
HGB: 15.5 g/dL — ABNORMAL HIGH (ref 10.9–14.3)
LYMPHOCYTE #: 2 10*3/uL (ref 1.00–3.00)
LYMPHOCYTE %: 27 % (ref 16–44)
MCH: 28.5 pg (ref 24.7–32.8)
MCHC: 32.9 g/dL (ref 32.3–35.6)
MCV: 86.7 fL (ref 75.5–95.3)
MONOCYTE #: 0.5 10*3/uL (ref 0.30–1.00)
MONOCYTE %: 7 % (ref 5–13)
MPV: 9.3 fL (ref 7.9–10.8)
NEUTROPHIL #: 4.5 10*3/uL (ref 1.85–7.80)
NEUTROPHIL %: 62 % (ref 43–77)
PLATELETS: 256 10*3/uL (ref 140–440)
RBC: 5.43 10*6/uL — ABNORMAL HIGH (ref 3.63–4.92)
RDW: 13.8 % (ref 12.3–17.7)
WBC: 7.2 10*3/uL (ref 3.8–11.8)

## 2022-08-12 ENCOUNTER — Other Ambulatory Visit (HOSPITAL_COMMUNITY): Payer: Self-pay | Admitting: FAMILY PRACTICE

## 2022-08-12 ENCOUNTER — Other Ambulatory Visit: Payer: 59 | Attending: FAMILY PRACTICE

## 2022-08-12 ENCOUNTER — Other Ambulatory Visit: Payer: Self-pay

## 2022-08-12 DIAGNOSIS — I10 Essential (primary) hypertension: Secondary | ICD-10-CM | POA: Insufficient documentation

## 2022-08-12 DIAGNOSIS — E611 Iron deficiency: Secondary | ICD-10-CM | POA: Insufficient documentation

## 2022-08-12 DIAGNOSIS — D179 Benign lipomatous neoplasm, unspecified: Secondary | ICD-10-CM

## 2022-08-12 DIAGNOSIS — E039 Hypothyroidism, unspecified: Secondary | ICD-10-CM | POA: Insufficient documentation

## 2022-08-12 LAB — MAGNESIUM: MAGNESIUM: 2 mg/dL (ref 1.9–2.7)

## 2022-08-12 LAB — CBC WITH DIFF
BASOPHIL #: 0.1 10*3/uL (ref 0.00–0.10)
BASOPHIL %: 1 % (ref 0–1)
EOSINOPHIL #: 0.2 10*3/uL (ref 0.00–0.50)
EOSINOPHIL %: 3 %
HCT: 48 % — ABNORMAL HIGH (ref 31.2–41.9)
HGB: 16.2 g/dL — ABNORMAL HIGH (ref 10.9–14.3)
LYMPHOCYTE #: 2 10*3/uL (ref 1.00–3.00)
LYMPHOCYTE %: 27 % (ref 16–44)
MCH: 28.8 pg (ref 24.7–32.8)
MCHC: 33.8 g/dL (ref 32.3–35.6)
MCV: 85.3 fL (ref 75.5–95.3)
MONOCYTE #: 0.5 10*3/uL (ref 0.30–1.00)
MONOCYTE %: 7 % (ref 5–13)
MPV: 8.9 fL (ref 7.9–10.8)
NEUTROPHIL #: 4.7 10*3/uL (ref 1.85–7.80)
NEUTROPHIL %: 62 % (ref 43–77)
PLATELETS: 251 10*3/uL (ref 140–440)
RBC: 5.62 10*6/uL — ABNORMAL HIGH (ref 3.63–4.92)
RDW: 13.7 % (ref 12.3–17.7)
WBC: 7.6 10*3/uL (ref 3.8–11.8)

## 2022-08-12 LAB — FERRITIN: FERRITIN: 134 ng/mL (ref 11–336)

## 2022-08-12 LAB — CREATINE KINASE (CK), TOTAL, SERUM OR PLASMA: CREATINE KINASE: 71 U/L (ref 30–223)

## 2022-08-12 LAB — THYROID STIMULATING HORMONE (SENSITIVE TSH): TSH: 3.558 u[IU]/mL (ref 0.450–5.330)

## 2022-08-14 ENCOUNTER — Ambulatory Visit
Admission: RE | Admit: 2022-08-14 | Discharge: 2022-08-14 | Disposition: A | Discharge status: Home / Self Care | Payer: 59 | Source: Ambulatory Visit | Attending: FAMILY PRACTICE | Admitting: FAMILY PRACTICE

## 2022-08-14 ENCOUNTER — Other Ambulatory Visit: Payer: Self-pay

## 2022-08-14 DIAGNOSIS — D179 Benign lipomatous neoplasm, unspecified: Secondary | ICD-10-CM

## 2022-09-17 ENCOUNTER — Other Ambulatory Visit: Payer: Self-pay

## 2022-09-17 ENCOUNTER — Other Ambulatory Visit (HOSPITAL_COMMUNITY): Payer: Self-pay | Admitting: FAMILY PRACTICE

## 2022-09-17 ENCOUNTER — Inpatient Hospital Stay
Admission: RE | Admit: 2022-09-17 | Discharge: 2022-09-17 | Disposition: A | Discharge status: Home / Self Care | Payer: 59 | Source: Ambulatory Visit | Attending: FAMILY PRACTICE | Admitting: FAMILY PRACTICE

## 2022-09-17 DIAGNOSIS — M25511 Pain in right shoulder: Secondary | ICD-10-CM

## 2022-11-20 IMAGING — MR MRI SHOULDER RT W/O CONTRAST
6 series · 38 of 40 positions shown · IV contrast (gadolinium)
Comparison: None available.

﻿EXAM:  00002   MRI SHOULDER RT W/O CONTRAST
INDICATION: 57-year-old with persistent right shoulder pain. No history of trauma. Diminished range of motion.  No history of previous surgery.
TECHNIQUE: Multiplanar, multisequential MRI of the right shoulder was performed without gadolinium contrast.

[Series 6: T1 · oblique · right · 3.5mm · 0.33mm/px · 7 of 18 slices shown]
[im 1/18]
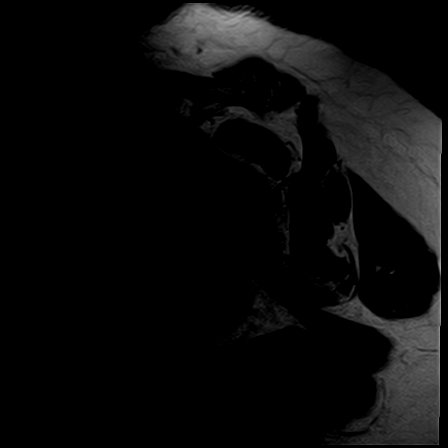
[im 3/18]
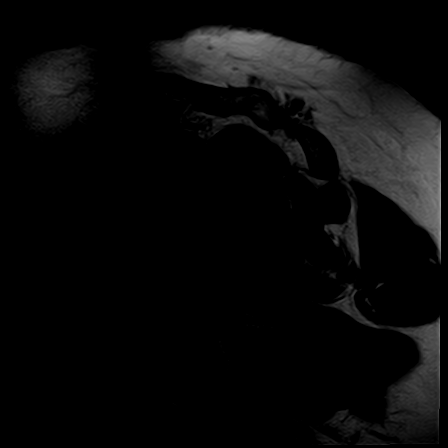
[im 6/18]
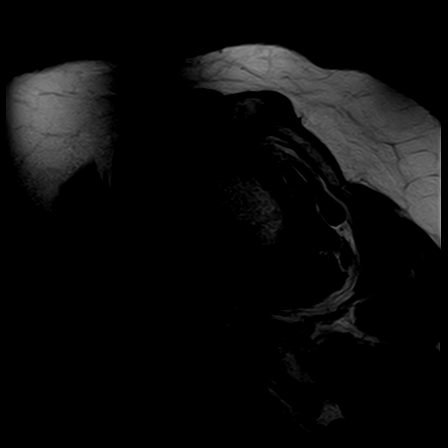
[im 9/18]
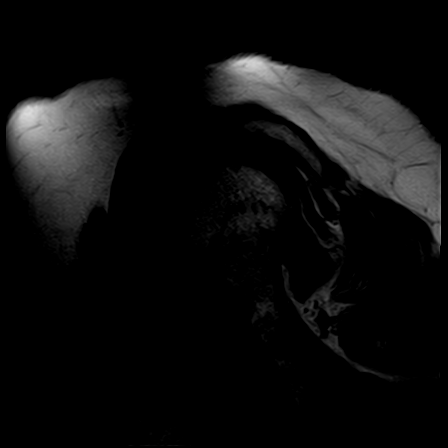
[im 12/18]
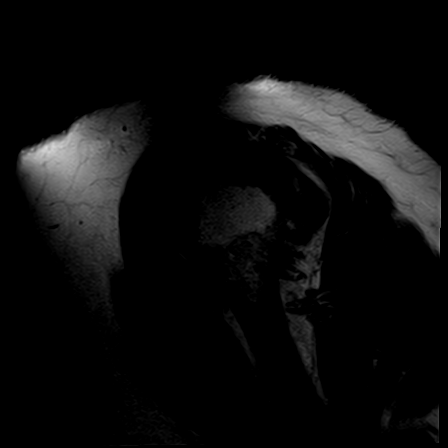
[im 15/18]
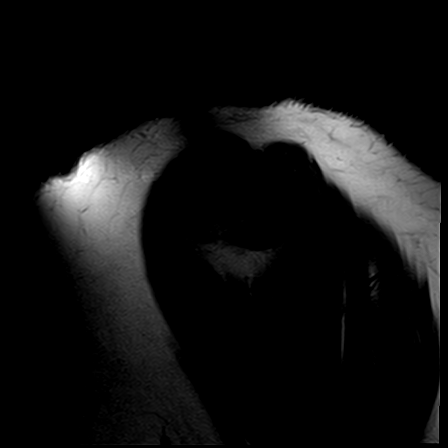
[im 18/18]
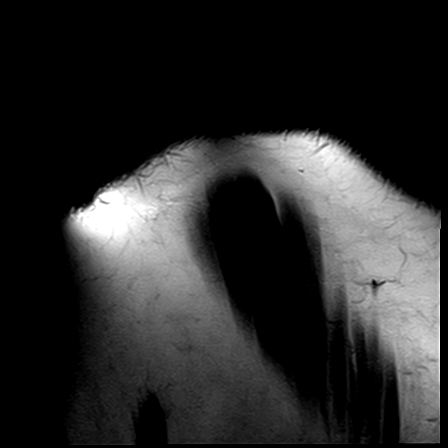

[Series 7: STIR · oblique · right · 3.5mm · 0.47mm/px · 7 of 18 slices shown (1 of 2)]
[im 1/18]
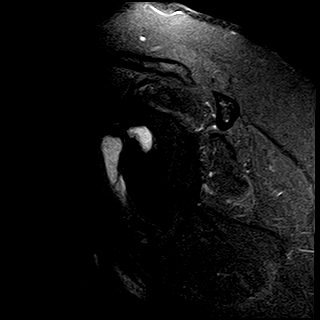
[im 3/18]
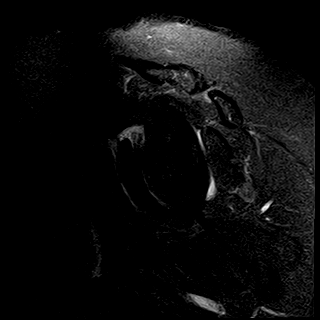
[im 6/18]
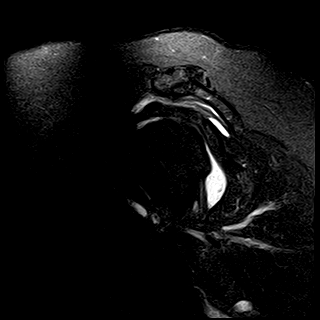
[im 9/18]
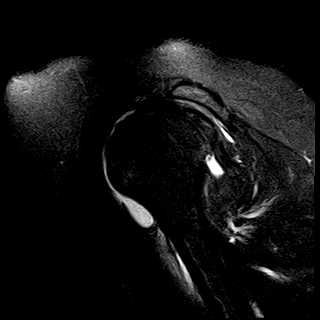
[im 12/18]
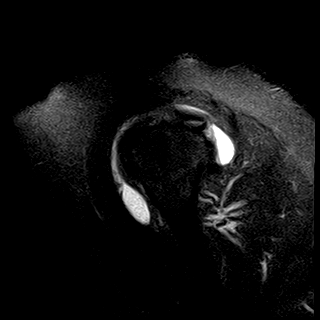
[im 15/18]
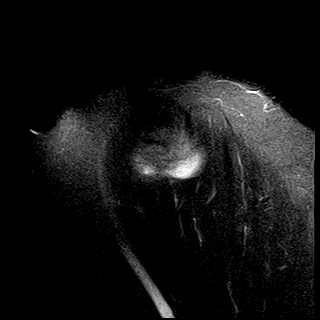
[im 18/18]
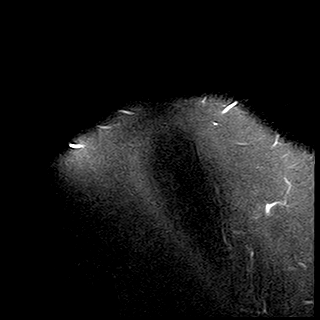

[Series 8: PD fat-sat · axial · right · 4.0mm · 0.56mm/px · z∈[-18,+58]mm · 6 of 18 slices shown (1 of 2)]
[im 1/18]
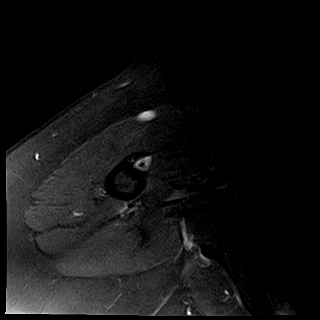
[im 4/18]
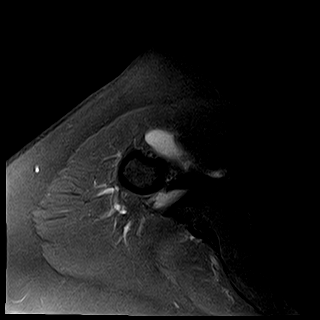
[im 7/18]
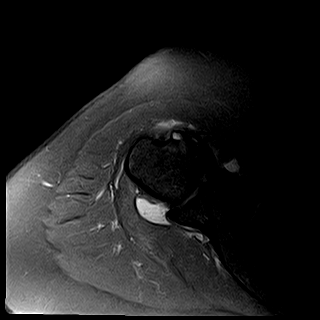
[im 11/18]
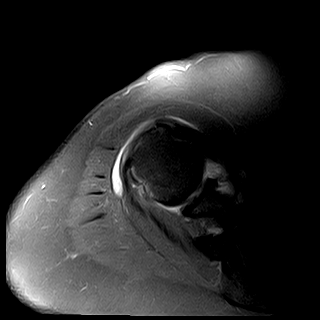
[im 14/18]
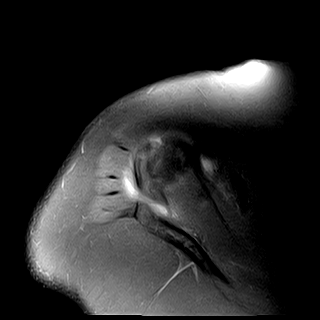
[im 18/18]
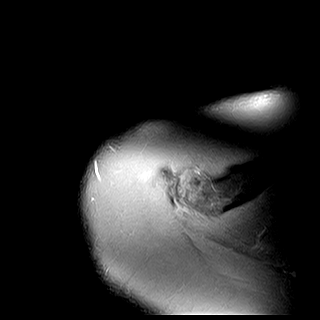

[Series 9: T2 fat-sat · axial · right · 4.0mm · 0.42mm/px · z∈[-33,+69]mm · 8 of 24 slices shown]
[im 1/24]
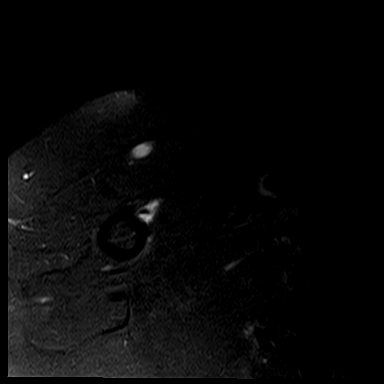
[im 4/24]
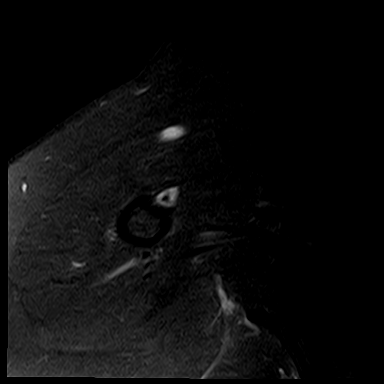
[im 7/24]
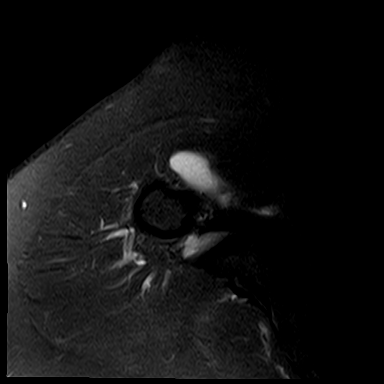
[im 10/24]
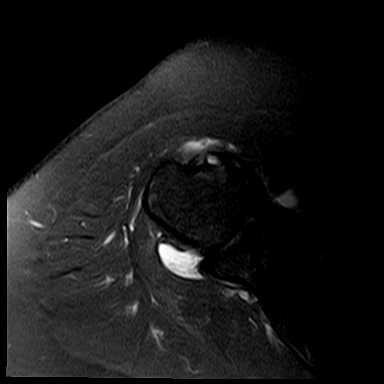
[im 14/24]
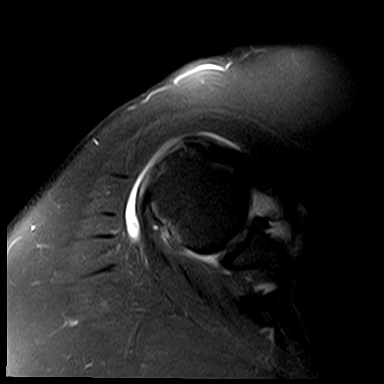
[im 17/24]
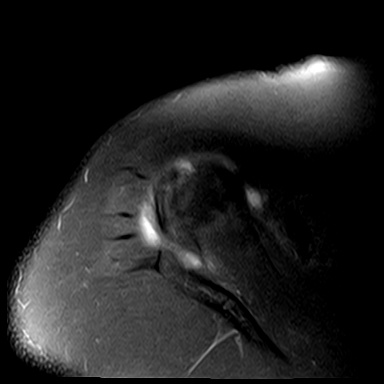
[im 20/24]
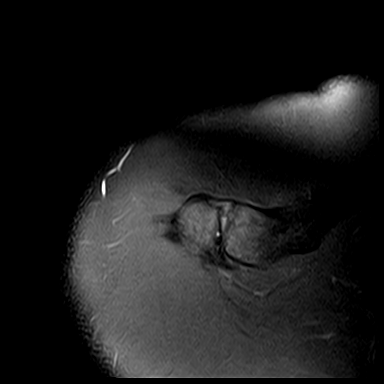
[im 24/24]
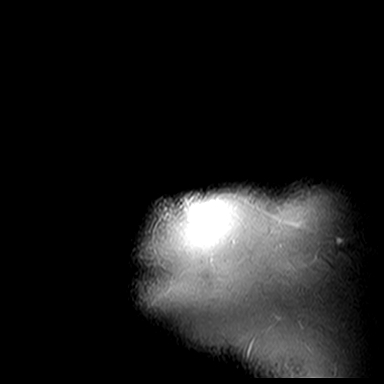

[Series 10: STIR · oblique · right · 3.5mm · 0.56mm/px · 4 of 18 slices shown (2 of 2)]
[im 1/18]
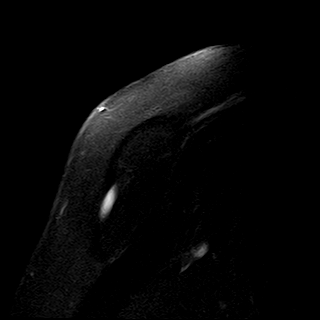
[im 4/18]
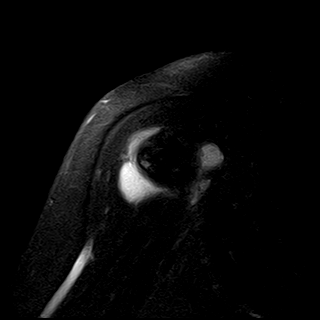
[im 7/18]
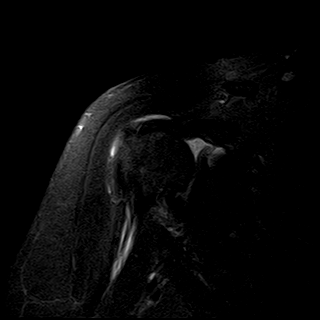
[im 11/18]
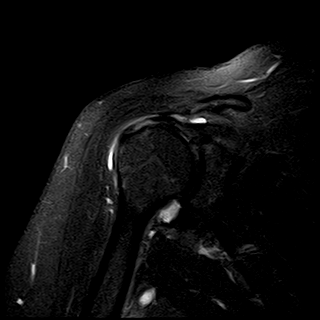

[Series 11: PD fat-sat · oblique · right · 3.5mm · 0.56mm/px · 6 of 18 slices shown (2 of 2)]
[im 1/18]
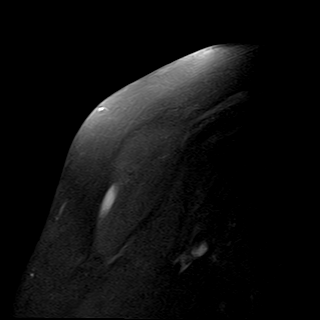
[im 4/18]
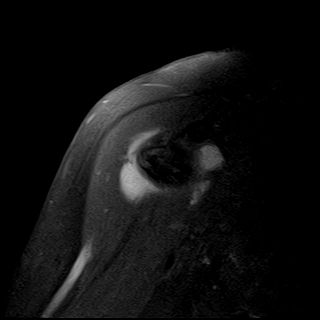
[im 7/18]
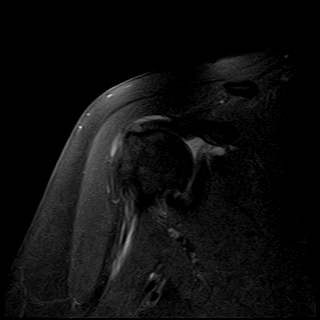
[im 11/18]
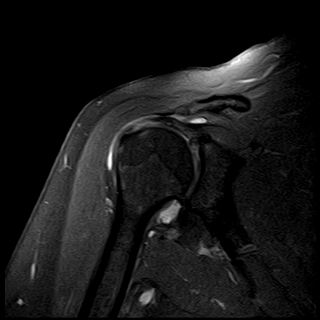
[im 14/18]
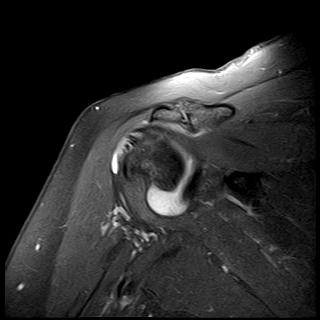
[im 18/18]
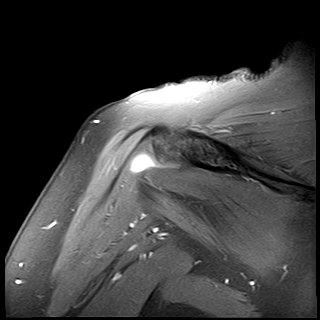

[38 of 40 positions shown; findings below may reference images not displayed]

FINDINGS: No acute bony lesions are seen at the right shoulder.

There is a 4 x 6 mm size full-thickness tear of distal supraspinatus tendon.  Degenerative changes of AC joint are mildly impinging on subacromion bursa in the supraspinatus.

Mild infraspinatus tendinitis.  Fluid-distended glenohumeral joint and subacromion bursa.

Moderate degenerative changes of glenohumeral joint and glenoid labrum. No evidence of labral tear.  The proximal long head of biceps tendon is not clearly visualized.
IMPRESSION: 1. Degenerative changes of AC joint are mildly impinging on subacromion space including supraspinatus. 

2. A 4 x 6 mm size small full-thickness tear of distal supraspinatus tendon.

3. Fluid-distended glenohumeral joint and subacromion bursa.

4. Degenerative changes of glenohumeral joint and glenoid labrum. Proximal long head of biceps tendon is not clearly visualized.

## 2023-03-12 ENCOUNTER — Other Ambulatory Visit (HOSPITAL_COMMUNITY): Payer: Self-pay

## 2023-03-12 ENCOUNTER — Encounter (HOSPITAL_COMMUNITY): Payer: Self-pay

## 2023-03-12 ENCOUNTER — Other Ambulatory Visit: Payer: Self-pay

## 2023-03-12 ENCOUNTER — Ambulatory Visit
Admission: RE | Admit: 2023-03-12 | Discharge: 2023-03-12 | Disposition: A | Discharge status: Still In Hospital | Payer: 59 | Source: Ambulatory Visit

## 2023-03-12 DIAGNOSIS — Z9889 Other specified postprocedural states: Secondary | ICD-10-CM | POA: Insufficient documentation

## 2023-03-12 NOTE — PT Evaluation (Addendum)
[facility] Medicine [facility]  Outpatient Physical Therapy  [address]  [place], [address]  [phone]  (Fax) [phone]       Physical Therapy Upper Extremity Evaluation    Date: 03/12/2023  Patient's Name: Sandra Carlson  Date of Birth: 01-Oct-1965  Physical Therapy Evaluation      PT diagnosis s/p R RTC repair  Reason for Referral: Pt had experienced R supraspinatus tear in November 2023. She underwent R RTC repair on 02/12/2023 with Dr. Verner Mould             SUBJECTIVE  Date of surgery: 02/12/2023    PLOF: IND and unrestricted, RHD    PMH: auditory neuroma    Medications for this problem:  none    Patient goals: REDUCE PAIN    Occupation:  works for the state    Next MD visit: August 7th    Pain location: R shoulder                    Pain description:  no pain    Pain frequency:  INTERMITTENT    Pain rating: Now 0   Best 0   Worst 3    Radiculopathy: none    Pain increases with: POSITION CHANGE and ADLs           decreases with : REST    Sensation: intact to LT    Weakness: R UE    Sleep affected: sleeping in recliner    Headaches: no    Dizziness: no    Subjective Functional Reports:    Sitting: WFL    Standing: WFL    Walking: WFL    Lifting: LIMITED    Patient-Specific Functional Score:    Problem Score   1. Lifting a gallon of milk from the fridge to a counter top 0 (NA)   2. Reaching watering can [place] to water plants 0 (NA)   3. Sleeping through the night in bed 0 (NA)   TOTAL 0     OBJECTIVE    Shoulder PROM   right   Flexion 90 (per protocol)   Extension NT   Abduction 90 in scap plane (per protocol)   ER 20 deg (per protocol)   IR NT       Elbow AROM  WNL    ROM comment : no pain with PROM    Strength (defined as __/5 based on 0/5 - 5/5 grading system)    Strength comment  R ue STRENGTH <3/5 THROUGHOUT; shoulder strength NT due to protocol     Joint mobility hypomobility throughout R shoulder, Scapulothoracic jt    Palpation: no TTP    Posture: WNL    Cervical screening:negative, mild  tightness in B UT    Treatment provided:REVIEW OF POC AND GOALS WITH PATIENT, ALL QUESTIONS ANSWERED, PATIENT EDUCATION, and THERAPEUTIC EXERCISE (PROM per protocol x 15 mi)          ASSESSMENT    Impression: Pt presents 4 weeks s/p R RTC repair with decreased R shoulder strength and ROM. She reports no pain throughout today's evaluation and she is being compliant with precautions and sling use at home. She is currently having difficulty with reaching, grasping, and manipulating objects with her R hand, dressing, washing, sleeping, lifting, and working. She will benefit from skilled PT services to restore strength and mobility in accordance with protocol in order to optimize R shoulder function for aforementioned ADLs.     Rehab potential:  GOOD      Short term goals (4 weeks):  -Pt will be pain free without sling during ADLs and work  -Pt will be able to drive and apply seatbelt to improve her ability to go to work  -Pt will be able to sleep through the night  -Pt will demo at least 120 deg of R shoulder flexion AROM and PROM to demo readiness to progress to next phase of protocol        Long term goals (8 weeks):  -Pt will demo full and pain free R shoulder AROM without compensations to improve her ability to reach [facility].   -Pt will be able to lift 10 pounds from waist ht to [facility] without pain to improve her ability to water her flowers.   -Pt will score at least 7 on PSFS to progress towards pt stated goals.   -Pt will become IND and compliant with HEP to optimize postoperative function and reduce risk of reinjury.             PLAN  Patient will attend 2-3 times per week x 8 weeks. Therapy may include, but is not limited to THERAPEUTIC EXERCISES, MYOFASCIAL/JOINT MOBILIZATION, POSTURE/BODY MECHANICS, ERGONOMIC TRAINING, HOME INSTRUCTIONS, HEAT/COLD, ELECTRICAL STIMULATION, and KINESIOTAPE    Plan for next visit Pulleys in pain free scaption plane, UBE, seated scap squeezes and clocks, UT stretch, Elbow/wrist/hand AROM,  6 way submax (25%) isometrics, want flesion AAROM to 90 deg       Evaluation complexity:   Personal factors impacting POC:  none   Co-morbidities impacting POC:  none  Complexity of physical exam: INCLUDING MUSCULOSKELETAL SYSTEM (POSTURE, ROM, STRENGTH, HEIGHT/WEIGHT) and INCLUDING ACTIVITY/MOBILITY RESTRICTIONS   Clinical Presentation: STABLE   Evaluation Complexity: LOW-HISTORY 0, EXAMINATION 1-2, STABLE PRESENTATION        Total Session Time 45, Timed code minutes 15, and Untimed code minutes 30        Intervention minutes: EVALUATION 30 minutes and THERAPEUTIC EXERCISE 15 minutes    Shirlean Schlein, PT  03/12/2023, 08:54    Start of Service: _________          Certification:    From:______  Through:_________    I certify the need for these services furnished under this plan of treatment and while under my care.    Referring Provider Signature: _______________     Date : _____________________    Printed Name of Referring Provider: __________________________________________

## 2023-03-14 ENCOUNTER — Ambulatory Visit
Admission: RE | Admit: 2023-03-14 | Discharge: 2023-03-14 | Disposition: A | Discharge status: Still In Hospital | Payer: 59 | Source: Ambulatory Visit

## 2023-03-14 ENCOUNTER — Other Ambulatory Visit: Payer: Self-pay

## 2023-03-14 NOTE — PT Treatment (Signed)
[facility] Medicine [facility]  Outpatient Physical Therapy  [address]  [place], [address]  [phone]  (Fax) [phone]    Physical Therapy Treatment Note    Date: 03/14/2023  Patient's Name: Sandra Carlson  Date of Birth: 11-18-1965  Physical Therapy Visit      Visit #/POC: 2/24  Authorization:-  POC Signed?: no  POC Ends: 05/07/2023  Next Progress Note Due: 04/12/2023 to visit 10      Evaluating Physical Therapist: Shirlean Schlein  PT diagnosis/Reason for Referral: R supraspinatus repair 02/12/2023  Next Scheduled Physician Appointment: Aug 7th  Allergies/Contraindications: auditory neuroma with dizziness when going supine -> sit    Subjective: No changes since IE. No pain upon arrival    Objective: exercises per protocol      EXERCISE/ACTIVITY NAME REPETITIONS RESISTANCE COMPLETED THIS DOS   Pulleys to 90 deg   3 x 1' - y   Ube FW   4' - y   3 way gentle isos (flex, ext, IR)   X 10 ea - y   AROM bicep curls   10x - y   digiflex 3' green y   Scapular clocks 10x  y   R UT stretch 3'  y   PROM (ER 20 deg, Abd/flex 90 deg)   75' - y         Assessment: Pt has good tolerance to exercise progression per protocol. No pain reported during or after treatment. She does demo reluctance to relax during PROM.     Short term goals (4 weeks):  -Pt will be pain free without sling during ADLs and work  -Pt will be able to drive and apply seatbelt to improve her ability to go to work  -Pt will be able to sleep through the night  -Pt will demo at least 120 deg of R shoulder flexion AROM and PROM to demo readiness to progress to next phase of protocol        Long term goals (8 weeks):  -Pt will demo full and pain free R shoulder AROM without compensations to improve her ability to reach [facility].   -Pt will be able to lift 10 pounds from waist ht to [facility] without pain to improve her ability to water her flowers.   -Pt will score at least 7 on PSFS to progress towards pt stated goals.   -Pt will become IND and  compliant with HEP to optimize postoperative function and reduce risk of reinjury.     Plan: continue per protocol and POC    Total Session Time 39 and Timed code minutes 39  THERAPEUTIC EXERCISE 39 minutes      Shirlean Schlein, PT  03/14/2023, 10:32

## 2023-03-18 ENCOUNTER — Ambulatory Visit (HOSPITAL_COMMUNITY)
Admission: RE | Admit: 2023-03-18 | Discharge: 2023-03-18 | Disposition: A | Discharge status: Still In Hospital | Payer: 59 | Source: Ambulatory Visit

## 2023-03-18 ENCOUNTER — Other Ambulatory Visit: Payer: Self-pay

## 2023-03-18 DIAGNOSIS — Z9889 Other specified postprocedural states: Secondary | ICD-10-CM

## 2023-03-18 NOTE — PT Treatment (Signed)
[facility] Medicine [facility]  Outpatient Physical Therapy  [address]  [place], [address]  [phone]  (Fax) [phone]    Physical Therapy Treatment Note    Date: 03/18/2023  Patient's Name: Sandra Carlson  Date of Birth: [birth year]  Physical Therapy Visit      Visit #/POC: 3/24  Authorization:-  POC Signed?: no  POC Ends: 05/07/2023  Next Progress Note Due: 04/12/2023 to visit 10      Evaluating Physical Therapist: Shirlean Schlein  PT diagnosis/Reason for Referral: R supraspinatus repair 02/12/2023  Next Scheduled Physician Appointment: Aug 7th  Allergies/Contraindications: auditory neuroma with dizziness when going supine -> sit    Subjective: Feeling well today. No pain at rest.     Objective: exercises per protocol      EXERCISE/ACTIVITY NAME REPETITIONS RESISTANCE COMPLETED THIS DOS   Pulleys to 90 deg   3 x 1' - y   Ube FW   5' - y   3 way gentle isos (flex, ext, IR)   X 10 ea - n   AROM bicep curls   15 - y   digiflex 20 each  green y   Scapular clocks 15  y   R UT stretch 5 x 20 sec  y   PROM (ER 20 deg, Abd/flex 90 deg)   63' - y         Assessment: tolerated today's session well with no increase in symptoms.     Short term goals (4 weeks):  -Pt will be pain free without sling during ADLs and work  -Pt will be able to drive and apply seatbelt to improve her ability to go to work  -Pt will be able to sleep through the night  -Pt will demo at least 120 deg of R shoulder flexion AROM and PROM to demo readiness to progress to next phase of protocol        Long term goals (8 weeks):  -Pt will demo full and pain free R shoulder AROM without compensations to improve her ability to reach [facility].   -Pt will be able to lift 10 pounds from waist ht to [facility] without pain to improve her ability to water her flowers.   -Pt will score at least 7 on PSFS to progress towards pt stated goals.   -Pt will become IND and compliant with HEP to optimize postoperative function and reduce risk of reinjury.      Plan: continue per protocol and POC.    Total Session Time 38 and Timed code minutes 38  THERAPEUTIC EXERCISE 38 minutes      [employer], PTA  03/18/2023 08:04

## 2023-03-20 ENCOUNTER — Ambulatory Visit (HOSPITAL_COMMUNITY)
Admission: RE | Admit: 2023-03-20 | Discharge: 2023-03-20 | Disposition: A | Discharge status: Still In Hospital | Payer: 59 | Source: Ambulatory Visit

## 2023-03-20 ENCOUNTER — Other Ambulatory Visit: Payer: Self-pay

## 2023-03-20 NOTE — PT Treatment (Signed)
[facility] Medicine [facility]  Outpatient Physical Therapy  [address]  [place], [address]  [phone]  (Fax) [phone]    Physical Therapy Treatment Note    Date: 03/20/2023  Patient's Name: Sandra Carlson  Date of Birth: [birth year]  Physical Therapy Visit    Visit #/POC: 4/24  Authorization:-  POC Signed?: no  POC Ends: 05/07/2023  Next Progress Note Due: 04/12/2023 to visit 10        Evaluating Physical Therapist: Shirlean Schlein  PT diagnosis/Reason for Referral: R supraspinatus repair 02/12/2023  Next Scheduled Physician Appointment: Aug 7th  Allergies/Contraindications: auditory neuroma with dizziness when going supine -> sit     Subjective: Feeling well today. No pain at rest. Her sling is starting to fit more loosely.     Objective: exercises per protocol        EXERCISE/ACTIVITY NAME REPETITIONS RESISTANCE COMPLETED THIS DOS   Pulleys to 125 deg    3 x 1' - y   [place] FW/BW    5' - y   3 way gentle isos (flex, ext, IR)    X 10 ea, 1 sec holds - y   AROM bicep curls    15 - y   digiflex 20 each  green y   Scapular clocks 15   y   R UT stretch 5 x 20 sec   y   PROM (ER 45 deg, Abd 90 deg /flex 125 deg)    15' - y   Sling adjustments 5'  y            Assessment: tolerated today's session well with no increase in symptoms. Pain free PROM improved to 125 deg flexion and 45 deg ER. She demos improved posture and arm position after sling re-fitting today.     Short term goals (4 weeks):  -Pt will be pain free without sling during ADLs and work  -Pt will be able to drive and apply seatbelt to improve her ability to go to work  -Pt will be able to sleep through the night  -Pt will demo at least 120 deg of R shoulder flexion AROM and PROM to demo readiness to progress to next phase of protocol        Long term goals (8 weeks):  -Pt will demo full and pain free R shoulder AROM without compensations to improve her ability to reach [facility].   -Pt will be able to lift 10 pounds from waist ht to [facility] without  pain to improve her ability to water her flowers.   -Pt will score at least 7 on PSFS to progress towards pt stated goals.   -Pt will become IND and compliant with HEP to optimize postoperative function and reduce risk of reinjury.      Plan: continue per protocol and POC.    Total Session Time 40 and Timed code minutes 40  THERAPEUTIC EXERCISE 40 minutes      Shirlean Schlein, PT  03/20/2023, 08:54

## 2023-03-21 ENCOUNTER — Ambulatory Visit (HOSPITAL_COMMUNITY)
Admission: RE | Admit: 2023-03-21 | Discharge: 2023-03-21 | Disposition: A | Discharge status: Still In Hospital | Payer: 59 | Source: Ambulatory Visit

## 2023-03-21 NOTE — PT Treatment (Signed)
[facility] Medicine [facility]  Outpatient Physical Therapy  [address]  [place], [address]  [phone]  (Fax) [phone]    Physical Therapy Treatment Note    Date: 03/21/2023  Patient's Name: Sandra Carlson  Date of Birth: 1966-06-30  Physical Therapy Visit    Visit #/POC: 5/24  Authorization:-  POC Signed?: no  POC Ends: 05/07/2023  Next Progress Note Due: 04/12/2023 to visit 10        Evaluating Physical Therapist: Shirlean Schlein  PT diagnosis/Reason for Referral: R supraspinatus repair 02/12/2023  Next Scheduled Physician Appointment: Aug 7th  Allergies/Contraindications: 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     Objective: Warm up on UBE, there ex per flow sheet as noted below.        EXERCISE/ACTIVITY NAME REPETITIONS RESISTANCE COMPLETED THIS DOS   Pulleys to 125 deg    3 x 1' - y   [place] FW/BW    5' - y   3 way gentle isos (flex, ext, IR)    X 10 ea, 1 sec holds - y   AROM bicep curls    15 - y   digiflex 20 each  green y   Scapular clocks 15   y   R UT stretch 5 x 20 sec   y   PROM (ER 45 deg, Abd 90 deg /flex 125 deg)    15' - y   Sling adjustments 5'  n          Assessment:  Pt tolerated session well with no complaints.  Short term goals (4 weeks):  -Pt will be pain free without sling during ADLs and work  -Pt will be able to drive and apply seatbelt to improve her ability to go to work  -Pt will be able to sleep through the night  -Pt will demo at least 120 deg of R shoulder flexion AROM and PROM to demo readiness to progress to next phase of protocol        Long term goals (8 weeks):  -Pt will demo full and pain free R shoulder AROM without compensations to improve her ability to reach [facility].   -Pt will be able to lift 10 pounds from waist ht to [facility] without pain to improve her ability to water her flowers.   -Pt will score at least 7 on PSFS to progress towards pt stated goals.   -Pt will become IND and  compliant with HEP to optimize postoperative function and reduce risk of reinjury.    Plan: continue per protocol and POC.    Total Session Time 35 and Timed code minutes 35  THERAPEUTIC EXERCISE 35 minutes    Jimmie Dattilio, PTA 03/21/2023 09:26

## 2023-03-24 ENCOUNTER — Other Ambulatory Visit: Payer: Self-pay

## 2023-03-24 ENCOUNTER — Ambulatory Visit (HOSPITAL_COMMUNITY)
Admission: RE | Admit: 2023-03-24 | Discharge: 2023-03-24 | Disposition: A | Discharge status: Still In Hospital | Payer: 59 | Source: Ambulatory Visit

## 2023-03-24 NOTE — PT Treatment (Signed)
[facility] Medicine [facility]  Outpatient Physical Therapy  [address]  [place], [address]  [phone]  (Fax) [phone]    Physical Therapy Treatment Note    Date: 03/24/2023  Patient's Name: Sandra Carlson  Date of Birth: [birth year]  Physical Therapy Visit    Visit #/POC: 6/24  Authorization:-  POC Signed?: no  POC Ends: 05/07/2023  Next Progress Note Due: 04/12/2023 to visit 10        Evaluating Physical Therapist: Shirlean Schlein  PT diagnosis/Reason for Referral: R supraspinatus repair 02/12/2023  Next Scheduled Physician Appointment: Aug 7th  Allergies/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      Objective: Warm up on UBE, there ex per flow sheet as noted below.      EXERCISE/ACTIVITY NAME REPETITIONS RESISTANCE COMPLETED THIS DOS   Pulleys to 125 deg    3 x 1' - y   [place] FW/BW    5' - y   3 way gentle isos (flex, ext, IR)    X 10 ea, 1 sec holds - y   AROM bicep curls    15 - y   digiflex 20 each  green y   Scapular clocks 15   y   R UT stretch 5 x 20 sec   y   PROM (ER 45 deg, Abd 90 deg /flex 125 deg)    15' - y   Sling adjustments 5'  n          Assessment: PROM satisfactory within protocol limits however had most restrictions with flexion this visit.  Short term goals (4 weeks):  -Pt will be pain free without sling during ADLs and work  -Pt will be able to drive and apply seatbelt to improve her ability to go to work  -Pt will be able to sleep through the night  -Pt will demo at least 120 deg of R shoulder flexion AROM and PROM to demo readiness to progress to next phase of protocol        Long term goals (8 weeks):  -Pt will demo full and pain free R shoulder AROM without compensations to improve her ability to reach [facility].   -Pt will be able to lift 10 pounds from waist ht to [facility] without pain to improve her ability to water her flowers.   -Pt will score at least 7 on PSFS to progress  towards pt stated goals.   -Pt will become IND and compliant with HEP to optimize postoperative function and reduce risk of reinjury.    Plan: continue per protocol and POC.    Total Session Time 40 and Timed code minutes 40  THERAPEUTIC EXERCISE 40 minutes    Adora Yeh, PTA 03/24/2023 08:30

## 2023-03-26 ENCOUNTER — Ambulatory Visit
Admission: RE | Admit: 2023-03-26 | Discharge: 2023-03-26 | Disposition: A | Discharge status: Still In Hospital | Payer: 59 | Source: Ambulatory Visit | Attending: ORTHOPEDIC, SPORTS MEDICINE | Admitting: ORTHOPEDIC, SPORTS MEDICINE

## 2023-03-26 ENCOUNTER — Other Ambulatory Visit: Payer: Self-pay

## 2023-03-26 NOTE — PT Treatment (Signed)
[facility] Medicine [facility]  Outpatient Physical Therapy  [address]  [place], [address]  [phone]  (Fax) [phone]    Physical Therapy Treatment Note    Date: 03/26/2023  Patient's Name: Sandra Carlson  Date of Birth: [birth year]  Physical Therapy Visit    Visit #/POC: 7/24  Authorization:-  POC Signed?: no  POC Ends: 05/07/2023  Next Progress Note Due: 04/12/2023 to visit 10        Evaluating Physical Therapist: Shirlean Schlein  PT diagnosis/Reason for Referral: R supraspinatus repair 02/12/2023  Next Scheduled Physician Appointment: Aug 7th  Allergies/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 She is now 6 weeks out and her surgeon's office confirmed she can DC sling use.      Objective: Warm up on UBE, there ex per flow sheet as noted below.      EXERCISE/ACTIVITY NAME REPETITIONS RESISTANCE COMPLETED THIS DOS   Pulleys to 125 deg    3 x 1' - y   [place] FW/BW    5' - y   3 way gentle isos (flex, ext, IR)    X 10 ea, 1 sec holds - y   AROM bicep curls    15 - y   digiflex 20 each  green y   PD, Rows 3 x 10 ea  red tb y   R UT stretch 5 x 20 sec   y   Wand flexion AAROM 5'  y   PROM (ER 45 deg, Abd 90 deg /flex 135 deg)    15' - y          Assessment: PROM limited by muscle guarding, no pain. She was able to progress scapular strengthening today with PD and rows, and AAROM progression with supine wand flexion which was tolerated better than PROM.       Short term goals (4 weeks):  -Pt will be pain free without sling during ADLs and work  -Pt will be able to drive and apply seatbelt to improve her ability to go to work  -Pt will be able to sleep through the night  -Pt will demo at least 120 deg of R shoulder flexion AROM and PROM to demo readiness to progress to next phase of protocol        Long term goals (8 weeks):  -Pt will demo full and pain free R shoulder AROM without compensations to  improve her ability to reach [facility].   -Pt will be able to lift 10 pounds from waist ht to [facility] without pain to improve her ability to water her flowers.   -Pt will score at least 7 on PSFS to progress towards pt stated goals.   -Pt will become IND and compliant with HEP to optimize postoperative function and reduce risk of reinjury.    Plan: continue per protocol and POC.    Total Session Time 40 and Timed code minutes 40  THERAPEUTIC EXERCISE 40 minutes      Shirlean Schlein, PT  03/26/2023, 09:16

## 2023-03-28 ENCOUNTER — Ambulatory Visit (HOSPITAL_COMMUNITY)
Admission: RE | Admit: 2023-03-28 | Discharge: 2023-03-28 | Disposition: A | Discharge status: Still In Hospital | Payer: 59 | Source: Ambulatory Visit

## 2023-03-28 ENCOUNTER — Other Ambulatory Visit: Payer: Self-pay

## 2023-03-28 DIAGNOSIS — M75121 Complete rotator cuff tear or rupture of right shoulder, not specified as traumatic: Secondary | ICD-10-CM | POA: Insufficient documentation

## 2023-03-28 DIAGNOSIS — Z9889 Other specified postprocedural states: Secondary | ICD-10-CM | POA: Insufficient documentation

## 2023-03-28 NOTE — PT Treatment (Signed)
[facility] Medicine [facility]  Outpatient Physical Therapy  [address]  [place], [address]  [phone]  (Fax) [phone]    Physical Therapy Treatment Note    Date: 03/28/2023  Patient's Name: Sandra Carlson  Date of Birth: 10/24/1965  Physical Therapy Visit    Visit #/POC: 8/24  Authorization:-  POC Signed?: no  POC Ends: 05/07/2023  Next Progress Note Due: 04/12/2023 to visit 10        Evaluating Physical Therapist: Shirlean Schlein  PT diagnosis/Reason for Referral: R supraspinatus repair 02/12/2023  Next Scheduled Physician Appointment: Aug 7th  Allergies/Contraindications: 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      Objective: Warm up on UBE, there ex per flow sheet as noted below.      EXERCISE/ACTIVITY NAME REPETITIONS RESISTANCE COMPLETED THIS DOS   Pulleys to 125 deg    3 x 1' - y   [place] FW/BW    6' - y   3 way gentle isos (flex, ext, IR)    X 10 ea, 1 sec holds - y   AROM bicep curls    15 - y   digiflex 20 each  green y   PD, Rows 3 x 10 ea  red tb n   R UT stretch 5 x 20 sec   y   Wand flexion AAROM 5'  y   PROM (ER 45 deg, Abd 90 deg /flex 135 deg)    15' - y          Assessment: Tolerated session well. Reduced arm swing on right during gait. No increase in pain with exercises.    Short term goals (4 weeks):  -Pt will be pain free without sling during ADLs and work  -Pt will be able to drive and apply seatbelt to improve her ability to go to work  -Pt will be able to sleep through the night  -Pt will demo at least 120 deg of R shoulder flexion AROM and PROM to demo readiness to progress to next phase of protocol        Long term goals (8 weeks):  -Pt will demo full and pain free R shoulder AROM without compensations to improve her ability to reach [facility].   -Pt will be able to lift 10 pounds from waist ht to [facility] without pain to improve her ability to water her flowers.   -Pt will score  at least 7 on PSFS to progress towards pt stated goals.   -Pt will become IND and compliant with HEP to optimize postoperative function and reduce risk of reinjury.      Plan: continue per protocol and POC.    Total Session Time 44 and Timed code minutes 44  THERAPEUTIC EXERCISE 44 minutes    [employer], PTA  03/28/2023 08:47

## 2023-03-31 ENCOUNTER — Ambulatory Visit (HOSPITAL_COMMUNITY): Payer: Self-pay

## 2023-04-02 ENCOUNTER — Ambulatory Visit (HOSPITAL_COMMUNITY): Payer: Self-pay

## 2023-04-04 ENCOUNTER — Other Ambulatory Visit (HOSPITAL_COMMUNITY): Payer: Self-pay

## 2023-04-04 ENCOUNTER — Other Ambulatory Visit: Payer: Self-pay

## 2023-04-04 ENCOUNTER — Ambulatory Visit
Admission: RE | Admit: 2023-04-04 | Discharge: 2023-04-04 | Disposition: A | Discharge status: Still In Hospital | Payer: 59 | Source: Ambulatory Visit

## 2023-04-04 DIAGNOSIS — M75121 Complete rotator cuff tear or rupture of right shoulder, not specified as traumatic: Secondary | ICD-10-CM

## 2023-04-04 NOTE — Progress Notes (Signed)
[facility] Medicine [facility]  Outpatient Physical Therapy  [address]  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  She had a good f/u with her surgeon and arrives with new orders. She is now 7+ weeks p/o     Objective: Warm up on UBE, there ex per flow sheet as noted below.     Patient-Specific Functional Score:     Problem Score   1. Lifting a gallon of milk from the fridge to a counter top 0 (NA)   2. Reaching watering can OH to water plants 0 (NA)   3. Sleeping through the night in bed 3   TOTAL 1      OBJECTIVE     Shoulder PROM    right   Flexion 145 (per protocol)   Extension NT   Abduction 100 in scap plane (per protocol)   ER 60 deg (per protocol)   IR NT         EXERCISE/ACTIVITY NAME REPETITIONS RESISTANCE COMPLETED THIS DOS   Pulleys to 145 deg    3' - y   [place] FW/BW    6' - y   6 way gentle isos     X 10 ea, 1 sec holds - y   AROM bicep curls    15 - y   ER, IR AROM 20 each   y   ER/IR 3 x 10 ea yellow Y (HEP)   PD, Rows 3 x 10 ea  green tb y   R UT stretch 5 x 20 sec   y   Wand flexion AAROM 5'   y   PROM (ER 45 deg, Abd 90 deg /flex 145 deg)    10' - y      Access Code: [email or web address]  URL: [email or web address]  Date: 04/04/2023  Prepared by: Shirlean Schlein    Exercises  - Shoulder Extension with Resistance  - 1 x daily - 7 x weekly - 3 sets  - 10 reps  - Standing Bilateral Low Shoulder Row with Anchored Resistance  - 1 x daily - 7 x weekly - 3 sets - 10 reps  - Shoulder Internal Rotation with Resistance  - 1 x daily - 7 x weekly - 3 sets - 10 reps  - Shoulder External Rotation with Anchored Resistance  - 1 x daily - 7 x weekly - 3 sets - 10 reps  - Seated Shoulder Flexion Towel Slide at Table Top  - 1 x daily - 7 x weekly - 3 sets - 2 min hold      Assessment: Pt is making excellent progress towards all goals with appropriate gains in PROM per protocol. Plan to continue progressing per protocol. Updated HEP was provided and reviewed.      Short term goals (4 weeks):  -Pt  will be pain free without sling during ADLs and work  -Pt will be able to drive and apply seatbelt to improve her ability to go to work  -Pt will be able to sleep through the night  -Pt will demo at least 120 deg of R shoulder flexion AROM and PROM to demo readiness to progress to next phase of protocol        Long term goals (8 weeks):  -Pt will demo full and pain free R shoulder AROM without compensations to improve her ability to reach [facility].   -Pt will be able to lift 10 pounds from waist ht to [facility] without pain to improve her ability to water her flowers.   -Pt will score at least 7 on PSFS to progress towards pt stated goals.   -Pt will become IND and compliant with HEP to optimize postoperative function and reduce risk of reinjury.       Plan: continue per protocol 2x/wk x 6 weeks    Total Session Time 45 and Timed code minutes 45  THERAPEUTIC EXERCISE 35 minutes and JOINT MOBILIZATION/MFR 10 minutes      Shirlean Schlein, PT  04/04/2023, 08:06

## 2023-04-08 ENCOUNTER — Ambulatory Visit (HOSPITAL_COMMUNITY)
Admission: RE | Admit: 2023-04-08 | Discharge: 2023-04-08 | Disposition: A | Discharge status: Still In Hospital | Payer: 59 | Source: Ambulatory Visit

## 2023-04-08 ENCOUNTER — Other Ambulatory Visit: Payer: Self-pay

## 2023-04-08 NOTE — PT Treatment (Signed)
[facility] Medicine [facility]  Outpatient Physical Therapy  [address]  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 She had mild fatigue with 4 hr drive and going to the [place], but not pain. She is HEP compliant.     Objective: Warm up on UBE, there ex per flow sheet as noted below.      OBJECTIVE     Shoulder PROM    right   Flexion 145 (per protocol)   Extension NT   Abduction 100 in scap plane (per protocol)   ER 60 deg (per protocol)   IR NT         EXERCISE/ACTIVITY NAME REPETITIONS RESISTANCE COMPLETED THIS DOS   Pulleys to 145 deg    3' - y   [place] FW/BW    6' - y   Sleeper stretch 10 x 3 sec  y   6 way gentle isos     X 10 ea, 1 sec holds - y   AROM bicep curls    15 - y   ER, IR AROM 20 each    y   ER/IR 3 x 10 ea yellow Y (HEP)   PD, Rows 3 x 10 ea  green tb y   Manual perturbations (supine) 3 x 20 sec   y   Wand flexion AAROM 5'   y   AROM scaption to 90 deg  10x  y   AROM OH reach 8x     PROM (ER 45 deg, Abd 90 deg /flex 145 deg)    10' - y      Access Code: [email or web address]  URL: [email or web address]  Date: 04/04/2023  Prepared by: Shirlean Schlein     Assessment: Pt was able to roll onto R side for sleeper stretch without pain. She demos full AAROM per protocol and was able to progress AROM without pain.     Short term goals (4 weeks):  -Pt will be pain free without sling during ADLs  and work (Progressing 04/08/2023)  -Pt will be able to drive and apply seatbelt to improve her ability to go to work (MET 04/08/2023)  -Pt will be able to sleep through the night (MET 04/08/2023)  -Pt will demo at least 120 deg of R shoulder flexion AROM and PROM to demo readiness to progress to next phase of protocol (Progressing 04/08/2023)        Long term goals (8 weeks):  -Pt will demo full and pain free R shoulder AROM without compensations to improve her ability to reach [facility].  (Progressing 04/08/2023)  -Pt will be able to lift 10 pounds from waist ht to [facility] without pain to improve her ability to water her flowers. (Progressing 04/08/2023)  -Pt will score at least 7 on PSFS to progress towards pt stated goals. (Progressing 04/08/2023)  -Pt will become IND and compliant with HEP  to optimize postoperative function and reduce risk of reinjury. (Progressing 04/08/2023)      Plan: continue per protocol 2x/wk     Total Session Time 43 and Timed code minutes 43  THERAPEUTIC EXERCISE 43 minutes      Shirlean Schlein, PT  04/08/2023, 08:47

## 2023-04-11 ENCOUNTER — Ambulatory Visit (HOSPITAL_COMMUNITY)
Admission: RE | Admit: 2023-04-11 | Discharge: 2023-04-11 | Disposition: A | Discharge status: Still In Hospital | Payer: 59 | Source: Ambulatory Visit

## 2023-04-11 ENCOUNTER — Other Ambulatory Visit: Payer: Self-pay

## 2023-04-11 NOTE — PT Treatment (Signed)
[facility] Medicine [facility]  Outpatient Physical Therapy  [address]  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 She arrives with no pain. She has bene able to roll onto her R side at night     Objective: Warm up on UBE, there ex per flow sheet as noted below.        EXERCISE/ACTIVITY NAME REPETITIONS RESISTANCE COMPLETED THIS DOS   Pulleys to 145 deg    3' - y   [place] FW/BW    6' - y   Sleeper stretch 10 x 3 sec   y   6 way gentle isos     X 10 ea, 1 sec holds - y   AROM bicep curls    15 - y   ER, IR AROM 20 each    y   ER/IR 3 x 10 ea yellow Y (HEP)   PD, Rows 3 x 10 ea  green tb y   Manual perturbations (supine) 3 x 20 sec   y   Wand flexion AAROM 5'   y   AROM scaption to 90 deg  10x   y   AROM OH reach 8x       PROM (ER 45 deg, Abd 90 deg /flex 145 deg)    10' - y      Access Code: [email or web address]  URL: [email or web address]  Date: 04/04/2023  Prepared by: Shirlean Schlein     Assessment: Pt has great tolerance to all activities. Mild compensatory R UT shrug noted with AROM. No increased pain throughout treatment     Short term goals (4 weeks):  -Pt will be pain free without sling during ADLs and work (Progressing 04/08/2023)  -Pt will be able to drive and apply seatbelt to improve her ability to go to work (MET 04/08/2023)  -Pt will be able to sleep through the night  (MET 04/08/2023)  -Pt will demo at least 120 deg of R shoulder flexion AROM and PROM to demo readiness to progress to next phase of protocol (Progressing 04/08/2023)        Long term goals (8 weeks):  -Pt will demo full and pain free R shoulder AROM without compensations to improve her ability to reach [facility].  (Progressing 04/08/2023)  -Pt will be able to lift 10 pounds from waist ht to [facility] without pain to improve her ability to water her flowers. (Progressing 04/08/2023)  -Pt will score at least 7 on PSFS to progress towards pt stated goals. (Progressing 04/08/2023)  -Pt will become IND and compliant with HEP to optimize postoperative function and reduce risk of reinjury. (Progressing 04/08/2023)      Plan: continue per protocol 2x/wk     Total Session Time 43 and Timed code minutes 43  THERAPEUTIC EXERCISE 43 minutes      Shirlean Schlein, PT  04/11/2023, 08:10

## 2023-04-15 ENCOUNTER — Ambulatory Visit (HOSPITAL_COMMUNITY)
Admission: RE | Admit: 2023-04-15 | Discharge: 2023-04-15 | Disposition: A | Discharge status: Still In Hospital | Payer: 59 | Source: Ambulatory Visit

## 2023-04-15 ENCOUNTER — Other Ambulatory Visit: Payer: Self-pay

## 2023-04-15 NOTE — PT Treatment (Signed)
[facility] Medicine [facility]  Outpatient Physical Therapy  [address]  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 Shoulder has been doing well. She reports she had a bad day about a week ago but she was doing more typing/writing than her typical day normally requires.      Objective: Warm up on UBE, there ex per flow sheet as noted below.        EXERCISE/ACTIVITY NAME REPETITIONS RESISTANCE COMPLETED THIS DOS   Pulleys to 145 deg    3' - y   [place] FW/BW    6' - y   Sleeper stretch 10 x 3 sec   N    6 way gentle isos     X 10 ea, 2 sec holds - y   AROM bicep curls    15 - y   ER, IR AROM 20 each    y   ER/IR 3 x 10 ea yellow N (HEP)   PD, Rows 3 x 10 ea green tb y   Manual perturbations (supine) 3 x 20 sec   y   Wand flexion AAROM 5'   y   AROM scaption to 90 deg  10x   y   AROM OH reach 10x    y    PROM (ER 45 deg, Abd 90 deg /flex 145 deg)    10' - N       Access Code: [email or web address]  URL: [email or web address]  Date: 04/04/2023  Prepared by: Shirlean Schlein     Assessment: Patient tolerated session well. Needs cues with isometrics for form and to not push into pain.       Short term goals (4 weeks):  -Pt will be pain free without sling during ADLs and work (Progressing 04/08/2023)  -Pt will be able to drive and apply seatbelt to improve her ability to go to work (MET 04/08/2023)  -Pt  will be able to sleep through the night (MET 04/08/2023)  -Pt will demo at least 120 deg of R shoulder flexion AROM and PROM to demo readiness to progress to next phase of protocol (Progressing 04/08/2023)        Long term goals (8 weeks):  -Pt will demo full and pain free R shoulder AROM without compensations to improve her ability to reach [facility].  (Progressing 04/08/2023)  -Pt will be able to lift 10 pounds from waist ht to [facility] without pain to improve her ability to water her flowers. (Progressing 04/08/2023)  -Pt will score at least 7 on PSFS to progress towards pt stated goals. (Progressing 04/08/2023)  -Pt will become IND and compliant with HEP to optimize postoperative function and reduce risk of reinjury. (Progressing 04/08/2023)      Plan: continue per protocol 2x/wk     Total Session Time 40 and Timed code minutes 40  THERAPEUTIC EXERCISE 40  minutes      [employer], PTA  04/15/2023 08:49

## 2023-04-17 ENCOUNTER — Other Ambulatory Visit: Payer: Self-pay

## 2023-04-17 ENCOUNTER — Ambulatory Visit (HOSPITAL_COMMUNITY)
Admission: RE | Admit: 2023-04-17 | Discharge: 2023-04-17 | Disposition: A | Discharge status: Still In Hospital | Payer: 59 | Source: Ambulatory Visit

## 2023-04-17 DIAGNOSIS — Z9889 Other specified postprocedural states: Secondary | ICD-10-CM

## 2023-04-17 NOTE — PT Treatment (Signed)
[facility] Medicine [facility]  Outpatient Physical Therapy  [address]  [place], [address]  [phone]  (Fax) [phone]    Physical Therapy Treatment Note    Date: 04/17/2023  Patient's Name: Sandra Carlson  Date of Birth: 02/08/1966  Physical Therapy Visit      Visit #/POC: 13/24  Authorization:-  POC Signed?: no (new order 04/02/2023)  POC Ends: 05/07/2023  Next Progress Note Due: 04/12/2023 to visit 10        Evaluating Physical Therapist: Shirlean Schlein  PT diagnosis/Reason for Referral: R supraspinatus repair 02/12/2023  Next Scheduled Physician Appointment: September 11  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 Felt fine after last session, just a little bit "stiff". She thinks she may be sleeping in a bad position.      Objective: Warm up on UBE, there ex per flow sheet as noted below.        EXERCISE/ACTIVITY NAME REPETITIONS RESISTANCE COMPLETED THIS DOS   Pulleys to 145 deg    3' - y   [place] FW/BW    6' - y   Sleeper stretch 10 x 3 sec   N    6 way gentle isos     X 10 ea, 2 sec holds - y   AROM bicep curls    15 - y   ER, IR AROM 20 each    y   ER/IR 3 x 10 ea yellow N (HEP)   PD, Rows 3 x 15 ea green tb y   Manual perturbations (supine) 3 x 20 sec   y   Wand flexion AAROM 5'   y   AROM scaption to 90 deg  15x   y   AROM OH reach 15x   Y    PROM (ER 45 deg, Abd 90 deg /flex 145 deg)    10' - N       Access Code: [email or web address]  URL: [email or web address]  Date: 04/04/2023  Prepared by: Shirlean Schlein     Assessment: Patient is progressing well towards meeting goals. She was able to complete all tasks in clinic with no increase in pain.       Short term goals (4 weeks):  -Pt will be pain free without sling during ADLs and work (Progressing 04/08/2023)  -Pt will be able to drive and apply seatbelt to improve her ability to go to work (MET 04/08/2023)  -Pt will be able to sleep through the  night (MET 04/08/2023)  -Pt will demo at least 120 deg of R shoulder flexion AROM and PROM to demo readiness to progress to next phase of protocol (Progressing 04/08/2023)        Long term goals (8 weeks):  -Pt will demo full and pain free R shoulder AROM without compensations to improve her ability to reach [facility].  (Progressing 04/08/2023)  -Pt will be able to lift 10 pounds from waist ht to [facility] without pain to improve her ability to water her flowers. (Progressing 04/08/2023)  -Pt will score at least 7 on PSFS to progress towards pt stated goals. (Progressing 04/08/2023)  -Pt will become IND and compliant with HEP to optimize postoperative function and reduce risk of reinjury. (Progressing 04/08/2023)      Plan: Continue per protocol.     Total Session Time 40 and Timed code minutes 40  THERAPEUTIC EXERCISE 40 minutes      Cleone Hulick  Laraya Pestka, PTA  04/17/2023 08:49

## 2023-04-21 ENCOUNTER — Ambulatory Visit
Admission: RE | Admit: 2023-04-21 | Discharge: 2023-04-21 | Disposition: A | Discharge status: Still In Hospital | Payer: 59 | Source: Ambulatory Visit | Attending: ORTHOPEDIC, SPORTS MEDICINE | Admitting: ORTHOPEDIC, SPORTS MEDICINE

## 2023-04-21 ENCOUNTER — Other Ambulatory Visit: Payer: Self-pay

## 2023-04-21 DIAGNOSIS — Z9889 Other specified postprocedural states: Secondary | ICD-10-CM

## 2023-04-21 NOTE — PT Treatment (Signed)
[facility] Medicine [facility]  Outpatient Physical Therapy  [address]  [place], [address]  [phone]  (Fax) [phone]    Physical Therapy Treatment Note    Date: 04/21/2023  Patient's Name: Sandra Carlson  Date of Birth: 01-15-1966  Physical Therapy Visit      Visit #/POC: 14/24  Authorization:-  POC Signed?: no (new order 04/02/2023)  POC Ends: 05/07/2023  Next Progress Note Due: 04/12/2023 to visit 10        Evaluating Physical Therapist: Shirlean Schlein  PT diagnosis/Reason for Referral: R supraspinatus repair 02/12/2023  Next Scheduled Physician Appointment: September 11  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 Felt fine after last session, just a little bit "stiff". She thinks she may be sleeping in a bad position.      Objective: Warm up on UBE, there ex per flow sheet as noted below.        EXERCISE/ACTIVITY NAME REPETITIONS RESISTANCE COMPLETED THIS DOS   Pulleys to 145 deg    3' - y   [place] FW/BW    6' - y   Sleeper stretch 10 x 3 sec   N    6 way gentle isos     X 10 ea, 2 sec holds - y   AROM bicep curls    15 - y   ER, IR AROM 20 each    y   ER/IR 3 x 10 ea yellow N (HEP)   PD, Rows 3 x 15 ea green tb y   Manual perturbations (supine) 3 x 20 sec   y   Wand flexion AAROM 5'   y   AROM scaption to 90 deg  15x   y   AROM OH reach 15x   Y    PROM (ER 45 deg, Abd 90 deg /flex 145 deg)    10' - N    Ball roll at 90 degrees scaption on wall 10 rep   Y       Access Code: [email or web address]  URL: [email or web address]  Date: 04/04/2023  Prepared by: Shirlean Schlein     Assessment: Patient is progressing toward goals. She continues to be limited with some activities due to post-op restrictions. She notes improved sleep. Added ball roll on wall at 90 degrees scaption with good tolerance.     Short term goals (4 weeks):  -Pt will be pain free without sling during ADLs and work (Progressing  04/08/2023)  -Pt will be able to drive and apply seatbelt to improve her ability to go to work (MET 04/08/2023)  -Pt will be able to sleep through the night (MET 04/08/2023)  -Pt will demo at least 120 deg of R shoulder flexion AROM and PROM to demo readiness to progress to next phase of protocol (Progressing 04/08/2023)        Long term goals (8 weeks):  -Pt will demo full and pain free R shoulder AROM without compensations to improve her ability to reach [facility].  (Progressing 04/08/2023)  -Pt will be able to lift 10 pounds from waist ht to [facility] without pain to improve her ability to water her flowers. (Progressing 04/08/2023)  -Pt will score at least 7 on PSFS to progress towards pt stated goals. (Progressing 04/08/2023)  -Pt will become IND and compliant with HEP to optimize postoperative function and reduce risk of reinjury. (Progressing 04/08/2023)      Plan: Continue  per protocol.     Total Session Time 35 and Timed code minutes 35  THERAPEUTIC EXERCISE 35 minutes      Emory Leaver, PTA  04/21/2023 13:03

## 2023-04-25 ENCOUNTER — Ambulatory Visit (HOSPITAL_COMMUNITY): Payer: Self-pay

## 2023-04-30 ENCOUNTER — Ambulatory Visit (HOSPITAL_COMMUNITY)
Admission: RE | Admit: 2023-04-30 | Discharge: 2023-04-30 | Disposition: A | Discharge status: Still In Hospital | Payer: 59 | Source: Ambulatory Visit

## 2023-04-30 ENCOUNTER — Other Ambulatory Visit: Payer: Self-pay

## 2023-04-30 DIAGNOSIS — Z9889 Other specified postprocedural states: Secondary | ICD-10-CM | POA: Insufficient documentation

## 2023-04-30 DIAGNOSIS — M7541 Impingement syndrome of right shoulder: Secondary | ICD-10-CM | POA: Insufficient documentation

## 2023-04-30 DIAGNOSIS — M75121 Complete rotator cuff tear or rupture of right shoulder, not specified as traumatic: Secondary | ICD-10-CM | POA: Insufficient documentation

## 2023-04-30 NOTE — PT Treatment (Signed)
[facility] Medicine [facility]  Outpatient Physical Therapy  [address]  [place], [address]  [phone]  (Fax) [phone]    Physical Therapy Treatment Note    Date: 04/30/2023  Patient's Name: Sandra Carlson  Date of Birth: 12/02/1965  Physical Therapy Visit    Visit #/POC: 15/24  Authorization:-  POC Signed?: no (new order 04/02/2023)  POC Ends: 05/07/2023  Next Progress Note Due: visit 16        Evaluating Physical Therapist: Shirlean Schlein  PT diagnosis/Reason for Referral: R supraspinatus repair 02/12/2023  Next Scheduled Physician Appointment: September 11  Allergies/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 Her R shoulder did get a little sore after sleeping on it.      Objective: Warm up on UBE, there ex per flow sheet as noted below.         EXERCISE/ACTIVITY NAME REPETITIONS RESISTANCE COMPLETED THIS DOS   Pulleys     3' - y   Nelma Rothman FW/BW    6' - y   Sleeper stretch 10 x 3 sec   y   6 way gentle isos     X 10 ea, 2 sec holds - y   AROM bicep curls    15 - y   ER, IR AROM 20 each    y   ER/IR 3 x 10 ea red y   PD, Rows 3 x 15 ea green tb y   Manual perturbations (supine) 3 x 20 sec   y   Wand flexion AAROM 5'   y   AROM scaption 15x   y   AROM OH reach 15x   Y    PROM (ER 45 deg, Abd 90 deg /flex 145 deg)    10' - y   AAROM flexion at wall 2x 10 rep    Y       Access Code: [email or web address]  URL: [email or web address]  Date: 04/04/2023  Prepared by: Shirlean Schlein     Assessment: Progression was held today as pt has been ill and has not been to PT in the past week. She tolerated all exercises well and demos full PROM and AROM is 90%. She still has slight compensatory shrug at end range flexion and abduction. Plan to progress per protocol at next visit.      Short term goals (4 weeks):  -Pt will be pain free without sling during ADLs and work (Progressing  04/08/2023)  -Pt will be able to drive and apply seatbelt to improve her ability to go to work (MET 04/08/2023)  -Pt will be able to sleep through the night (MET 04/08/2023)  -Pt will demo at least 120 deg of R shoulder flexion AROM and PROM to demo readiness to progress to next phase of protocol (Progressing 04/08/2023)        Long term goals (8 weeks):  -Pt will demo full and pain free R shoulder AROM without compensations to improve her ability to reach [facility].  (Progressing 04/08/2023)  -Pt will be able to lift 10 pounds from waist ht to [facility] without pain to improve her ability to water her flowers. (Progressing 04/08/2023)  -Pt will score at least 7 on PSFS to progress towards pt stated goals. (Progressing 04/08/2023)  -Pt will become IND and compliant with HEP to optimize postoperative function and reduce risk of reinjury. (Progressing 04/08/2023)  Plan: Continue per protocol.     Total Session Time 45 and Timed code minutes 45  THERAPEUTIC EXERCISE 45 minutes      Shirlean Schlein, PT  04/30/2023, 10:01

## 2023-05-02 ENCOUNTER — Other Ambulatory Visit: Payer: Self-pay

## 2023-05-02 ENCOUNTER — Ambulatory Visit (HOSPITAL_COMMUNITY)
Admission: RE | Admit: 2023-05-02 | Discharge: 2023-05-02 | Disposition: A | Discharge status: Still In Hospital | Payer: 59 | Source: Ambulatory Visit

## 2023-05-02 NOTE — Progress Notes (Signed)
[facility] Medicine [facility]  Outpatient Physical Therapy  [address]  [place], [address]  [phone]  (Fax) [phone]    Physical Therapy Progress Note    Date: 05/02/2023  Patient's Name: Sandra Carlson  Date of Birth: [birth year]  Physical Therapy Progress Note     Visit #/POC: 16/24  Authorization:-  POC Signed?: no (new order 04/02/2023)  POC Ends: **needs new order  Next Progress Note Due: 06/01/2023        Evaluating Physical Therapist: Shirlean Schlein  PT diagnosis/Reason for Referral: R supraspinatus repair 02/12/2023  Next Scheduled Physician Appointment: September 11  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 She is sleeping through the night. F/u with surgeon next week.     Patient-Specific Functional Score:     Problem Score   1. Lifting a gallon of milk from the fridge to a counter top 2   2. Reaching watering can OH to water plants 0   3. Sleeping through the night in bed 8   TOTAL 3.3        Objective: Warm up on UBE, there ex per flow sheet as noted below.      Shoulder AROM/Strength    R GH AROM R GH STRENGTH   Flexion 150 (per protocol) 4-   Extension 45 4   Abduction 150 4   ER C2 4-   IR L1 4            EXERCISE/ACTIVITY NAME REPETITIONS RESISTANCE COMPLETED THIS DOS   Ube FW/BW    6' - y   Horizontal pull aparts 2 x 10  yellow y   Sleeper stretch 10 x 3 sec   y   OH press 2 x 10 1# with mirror BF y   bicep curls    2 x 15 2# y   ER, IR AROM 20 each    y   ER/IR 3 x 10 ea red y   PD, Rows 3 x 15 ea green tb y   Manual perturbations (supine) 3 x 20 sec   n   Wand flexion AAROM 5'   n   AROM scaption 15x   y   AROM OH reach 15x   Y    PROM (ER 45 deg, Abd 90 deg /flex 145 deg)    10' - n   AAROM flexion at wall 2x 10 rep    Y       Access Code: [email or web address]  URL: [email or web address]  Date: 04/04/2023  Prepared by: Shirlean Schlein     Assessment: Pt is progressing well  towards all goals. She demos improved ROM and improved strength and function. She will benefit from 1 more month of PT to further improve R shoulder and scapular strength and endurance and optimize participation in ADLs.      Short term goals (4 weeks):  -Pt will be pain free without sling during ADLs and work (MET 05/02/2023)  -Pt will be able to drive and apply seatbelt to improve her ability to go to work (MET 04/08/2023)  -Pt will be able to sleep through the night (MET 04/08/2023)  -Pt will demo at least 120 deg of R shoulder flexion AROM and PROM to demo readiness to progress to next phase of protocol (MET 05/02/2023)        Long term goals (8 weeks):  -Pt will demo full and pain free  R shoulder AROM without compensations to improve her ability to reach [facility].  (Nearly MET 05/02/2023)  -Pt will be able to lift 10 pounds from waist ht to [facility] without pain to improve her ability to water her flowers. (Progressing 05/02/2023)  -Pt will score at least 7 on PSFS to progress towards pt stated goals. (Progressing 05/02/2023)  -Pt will become IND and compliant with HEP to optimize postoperative function and reduce risk of reinjury. (MET 05/02/2023)      Plan: Continue per protocol.        Total Session Time 42 and Timed code minutes 42  THERAPEUTIC EXERCISE 42 minutes      Shirlean Schlein, PT  05/02/2023, 08:11

## 2023-05-06 ENCOUNTER — Ambulatory Visit (HOSPITAL_COMMUNITY): Payer: Self-pay

## 2023-05-15 ENCOUNTER — Ambulatory Visit (HOSPITAL_COMMUNITY)
Admission: RE | Admit: 2023-05-15 | Discharge: 2023-05-15 | Disposition: A | Discharge status: Still In Hospital | Payer: 59 | Source: Ambulatory Visit | Attending: ORTHOPEDIC, SPORTS MEDICINE

## 2023-05-15 ENCOUNTER — Other Ambulatory Visit: Payer: Self-pay

## 2023-05-15 DIAGNOSIS — M75121 Complete rotator cuff tear or rupture of right shoulder, not specified as traumatic: Secondary | ICD-10-CM

## 2023-05-15 DIAGNOSIS — Z9889 Other specified postprocedural states: Secondary | ICD-10-CM

## 2023-05-15 DIAGNOSIS — M7541 Impingement syndrome of right shoulder: Secondary | ICD-10-CM

## 2023-05-15 NOTE — PT Treatment (Signed)
[facility] Medicine [facility]  Outpatient Physical Therapy  [address]  [place], [address]  [phone]  (Fax) [phone]    Physical Therapy Treatment Note    Date: 05/15/2023  Patient's Name: Sandra Carlson  Date of Birth: November 23, 1965  Physical Therapy Visit    Visit #/POC: 17/24  Authorization:-  POC Signed?: no (new order 05/07/2023)  POC Ends: 06/01/2023        Evaluating Physical Therapist: Shirlean Schlein  PT diagnosis/Reason for Referral: R supraspinatus repair 02/12/2023  Next Scheduled Physician Appointment: September 11  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 Her surgeon is pleased with her progress, and she is now sleeping through the night. She is using her R arm for most activities now.     Patient-Specific Functional Score:     Problem Score   1. Lifting a gallon of milk from the fridge to a counter top 3   2. Reaching watering can OH to water plants 3   3. Sleeping through the night in bed 10   TOTAL 5.3         Objective: Warm up on UBE, there ex per flow sheet as noted below.      Shoulder AROM/Strength    R GH AROM R GH STRENGTH   Flexion 170  4   Extension 55 4   Abduction 160 4   ER C2 4   IR L1 4              EXERCISE/ACTIVITY NAME REPETITIONS RESISTANCE COMPLETED THIS DOS   Ube FW/BW    6' - y   Horizontal pull aparts 2 x 10  yellow y   90/90 ER shoulder flexion 3 x 10  yellow y   Face pulls 3 x 10  red y   OH press 2 x 10 1# with mirror BF n   ER    3 x 10  red y   PD, Rows 3 x 15 ea Blue tb y   High ball bounce 3 x 30 sec   y   Wall push up 2 x 10  elbows tucked y   Mudlogger stretch 10x 3 sec hold   y   Body blade    n   Walk outs   n   PROM (flexion, abd ER    5' - y   Gr 3 post and inf R GH mobs x 15 ea 5'   Y       Access Code: [email or web address]     Assessment: Pt is progressing well towards all goals. She was able to progress R GH strengthening today with VC for form. No  increased pain reported throughout treatment.       Short term goals (4 weeks):  -Pt will be pain free without sling during ADLs and work (MET 05/02/2023)  -Pt will be able to drive and apply seatbelt to improve her ability to go to work (MET 04/08/2023)  -Pt will be able to sleep through the night (MET 04/08/2023)  -Pt will demo at least 120 deg of R shoulder flexion AROM and PROM to demo readiness to progress to next phase of protocol (MET 05/02/2023)        Long term goals (8 weeks):  -Pt will demo full and pain free R shoulder AROM without compensations to improve her ability to reach [facility].  (Nearly MET 05/02/2023)  -  Pt will be able to lift 10 pounds from waist ht to [facility] without pain to improve her ability to water her flowers. (Progressing 05/15/2023)  -Pt will score at least 7 on PSFS to progress towards pt stated goals. (Progressing 05/15/2023)  -Pt will become IND and compliant with HEP to optimize postoperative function and reduce risk of reinjury. (MET 05/02/2023)      Plan: Continue to strengthening phase of protocol 2x/wk until 06/01/2023    Total Session Time 40 and Timed code minutes 40  THERAPEUTIC EXERCISE 30 minutes and JOINT MOBILIZATION/MFR 10 minutes      Shirlean Schlein, PT  05/15/2023, 10:10

## 2023-05-19 ENCOUNTER — Other Ambulatory Visit (HOSPITAL_COMMUNITY): Payer: Self-pay

## 2023-05-19 DIAGNOSIS — M75121 Complete rotator cuff tear or rupture of right shoulder, not specified as traumatic: Secondary | ICD-10-CM

## 2023-05-19 DIAGNOSIS — M7541 Impingement syndrome of right shoulder: Secondary | ICD-10-CM

## 2023-05-20 ENCOUNTER — Ambulatory Visit (HOSPITAL_COMMUNITY)
Admission: RE | Admit: 2023-05-20 | Discharge: 2023-05-20 | Disposition: A | Discharge status: Still In Hospital | Payer: 59 | Source: Ambulatory Visit

## 2023-05-20 ENCOUNTER — Other Ambulatory Visit: Payer: Self-pay

## 2023-05-20 NOTE — PT Treatment (Signed)
[facility] Medicine [facility]  Outpatient Physical Therapy  [address]  [place], [address]  [phone]  (Fax) [phone]    Physical Therapy Treatment Note    Date: 05/20/2023  Patient's Name: Sandra Carlson  Date of Birth: 1965/10/27  Physical Therapy Visit    Visit #/POC: 18/24  Authorization: 20 fy (more auth req 05/20/2023)  POC Signed?: no (new order 05/07/2023)  POC Ends: 06/01/2023        Evaluating Physical Therapist: Shirlean Schlein  PT diagnosis/Reason for Referral: R supraspinatus repair 02/12/2023  Allergies/Contraindications: 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 She is having a little difficulty doing hair, but getting the milk out of the fridge is no problem.     Patient-Specific Functional Score:     Problem Score   1. Lifting a gallon of milk from the fridge to a counter top 6   2. Reaching watering can OH to water plants 5   3. Sleeping through the night in bed 10   TOTAL 7         Objective: Warm up on UBE, there ex per flow sheet as noted below.      Shoulder AROM/Strength    R GH AROM R GH STRENGTH   Flexion 170  4   Extension 55 4   Abduction 160 4   ER C2 4   IR L1 4              EXERCISE/ACTIVITY NAME REPETITIONS RESISTANCE COMPLETED THIS DOS   Ube FW/BW    6' - y   Horizontal pull aparts 2 x 10  green y   90/90 ER shoulder flexion 2 x 10  red y   Face pulls 3 x 10  red y   OH press 2 x 10 1# with mirror BF n   ER    2 x 10  green y   PD, Rows 3 x 15 ea Black y   High ball bounce 3 x 30 sec   y   Wall push up 2 x 10  elbows tucked y   Mudlogger stretch 10x 3 sec hold   y   Body blade  2 x 30 sec   y   Walk outs  2 x 10  red y      Access Code: [email or web address]     Assessment: Pt is progressing well towards all goals. She was able to progress dynamic stability and coordination with body blade.         Short term goals (4 weeks):  -Pt will be pain free without sling during ADLs and work (MET  05/02/2023)  -Pt will be able to drive and apply seatbelt to improve her ability to go to work (MET 04/08/2023)  -Pt will be able to sleep through the night (MET 04/08/2023)  -Pt will demo at least 120 deg of R shoulder flexion AROM and PROM to demo readiness to progress to next phase of protocol (MET 05/02/2023)        Long term goals (8 weeks):  -Pt will demo full and pain free R shoulder AROM without compensations to improve her ability to reach [facility].  (Nearly MET 05/02/2023)  -Pt will be able to lift 10 pounds from waist ht to [facility] without pain to improve her ability to water her flowers. (Progressing 05/15/2023)  -Pt will score at least 7  on PSFS to progress towards pt stated goals. (MET 05/20/2023)  -Pt will become IND and compliant with HEP to optimize postoperative function and reduce risk of reinjury. (MET 05/02/2023)      Plan: Continue to strengthening phase of protocol 2x/wk for 2 weeks    Total Session Time 40 and Timed code minutes 40  THERAPEUTIC EXERCISE 40 minutes      Shirlean Schlein, PT  05/20/2023, 08:08

## 2023-05-23 ENCOUNTER — Ambulatory Visit (HOSPITAL_COMMUNITY)
Admission: RE | Admit: 2023-05-23 | Discharge: 2023-05-23 | Disposition: A | Discharge status: Still In Hospital | Payer: 59 | Source: Ambulatory Visit | Attending: ORTHOPEDIC, SPORTS MEDICINE

## 2023-05-23 NOTE — PT Treatment (Signed)
[facility] Medicine [facility]  Outpatient Physical Therapy  [address]  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 States that he shoulder is doing great.      Patient-Specific Functional Score:     Problem Score   1. Lifting a gallon of milk from the fridge to a counter top 6   2. Reaching watering can OH to water plants 5   3. Sleeping through the night in bed 10   TOTAL 7         Objective: Warm up on UBE, there ex per flow sheet as noted below.      Shoulder AROM/Strength    R GH AROM R GH STRENGTH   Flexion 170  4   Extension 55 4   Abduction 160 4   ER C2 4   IR L1 4              EXERCISE/ACTIVITY NAME REPETITIONS RESISTANCE COMPLETED THIS DOS   Ube FW/BW    6' - y   Horizontal pull aparts 2 x 12 green y   90/90 ER shoulder flexion 2 x 10  red y   Face pulls 3 x 10  red y   OH press 2 x 10 1# with mirror BF n   ER    2 x 10  green y   PD, Rows 3 x 15 ea Black y   High ball bounce 3 x 30 sec  yellow PB y   Wall push up 2 x 10  elbows tucked y   Mudlogger stretch 10x 3 sec hold   y   Body blade  2 x 30 sec   y   Walk outs  2 x 10  red y      Access Code: [email or web address]     Assessment: Pt was able to perform all activities without any pain. She did require tactile cueing with walk out to prevent compensatory strategies.         Short term goals (4 weeks):  -Pt will be pain free without sling during ADLs and work (MET 05/02/2023)  -Pt will be able to drive and apply seatbelt to improve her  ability to go to work (MET 04/08/2023)  -Pt will be able to sleep through the night (MET 04/08/2023)  -Pt will demo at least 120 deg of R shoulder flexion AROM and PROM to demo readiness to progress to next phase of protocol (MET 05/02/2023)        Long term goals (8 weeks):  -Pt will demo full and pain free R shoulder AROM without compensations to improve her ability to reach [facility].  (Nearly MET 05/02/2023)  -Pt will be able to lift 10 pounds from waist ht to [facility] without pain to improve her ability to water her flowers. (Progressing 05/15/2023)  -Pt will score at least 7 on PSFS to progress towards pt stated goals. (MET 05/20/2023)  -Pt will become  IND and compliant with HEP to optimize postoperative function and reduce risk of reinjury. (MET 05/02/2023)      Plan: Continue to strengthening phase of protocol 2x/wk for 2 weeks           Total Session Time 32 and Timed code minutes 32  THERAPEUTIC EXERCISE 32 minutes      Trilby Leaver, PTA  05/23/2023, 15:34

## 2023-05-26 ENCOUNTER — Ambulatory Visit
Admission: RE | Admit: 2023-05-26 | Discharge: 2023-05-26 | Disposition: A | Discharge status: Still In Hospital | Payer: 59 | Source: Ambulatory Visit | Attending: ORTHOPEDIC, SPORTS MEDICINE | Admitting: ORTHOPEDIC, SPORTS MEDICINE

## 2023-05-26 ENCOUNTER — Other Ambulatory Visit: Payer: Self-pay

## 2023-05-26 NOTE — PT Treatment (Signed)
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 Shoulder is doing well, states that she only has 1 more session.     Patient-Specific Functional Score:     Problem Score   1. Lifting a gallon of milk from the fridge to a counter top 6   2. Reaching watering can OH to water plants 5   3. Sleeping through the night in bed 10   TOTAL 7         Objective: Warm up on UBE, there ex per flow sheet as noted below.      Shoulder AROM/Strength    R GH AROM R GH STRENGTH   Flexion 170  4   Extension 55 4   Abduction 160 4   ER C2 4   IR L1 4              EXERCISE/ACTIVITY NAME REPETITIONS RESISTANCE COMPLETED THIS DOS   Ube FW/BW    6' - y   Horizontal pull aparts 2 x 12 green y   90/90 ER shoulder flexion 2 x 10  red y   Face pulls 3 x 10  red y   OH press 2 x 10 1# with mirror BF n   ER    2 x 10  green y   PD, Rows 3 x 15 ea Black y   High ball bounce 3 x 30 sec  yellow PB y   Wall push up 2 x 10  elbows tucked y   Mudlogger stretch 10x 3 sec hold   y   Body blade  2 x 30 sec   y   Walk outs  2 x 10  red y      Access Code: [email or web address]     Assessment:  Tolerated session well. She has met most goals for therapy at this time.     Short term goals (4 weeks):  -Pt will be pain free without sling during ADLs and work (MET 05/02/2023)  -Pt will be able to drive and apply seatbelt to improve her ability to go to work (MET  04/08/2023)  -Pt will be able to sleep through the night (MET 04/08/2023)  -Pt will demo at least 120 deg of R shoulder flexion AROM and PROM to demo readiness to progress to next phase of protocol (MET 05/02/2023)        Long term goals (8 weeks):  -Pt will demo full and pain free R shoulder AROM without compensations to improve her ability to reach [facility].  (Nearly MET 05/02/2023)  -Pt will be able to lift 10 pounds from waist ht to [facility] without pain to improve her ability to water her flowers. (Progressing 05/15/2023)  -Pt will score at least 7 on PSFS to progress towards pt stated goals. (MET 05/20/2023)  -Pt will become IND and compliant with  HEP to optimize postoperative function and reduce risk of reinjury. (MET 05/02/2023)      Plan: Continue 1 more visit per PT POC           Total Session Time 38 and Timed code minutes 38  THERAPEUTIC EXERCISE 38 minutes    Paraskevi Funez, PTA 05/26/2023 17:21

## 2023-05-28 ENCOUNTER — Other Ambulatory Visit: Payer: Self-pay

## 2023-05-28 ENCOUNTER — Ambulatory Visit
Admission: RE | Admit: 2023-05-28 | Discharge: 2023-05-28 | Disposition: A | Discharge status: Still In Hospital | Payer: 59 | Source: Ambulatory Visit | Attending: ORTHOPEDIC, SPORTS MEDICINE

## 2023-05-28 DIAGNOSIS — Z9889 Other specified postprocedural states: Secondary | ICD-10-CM | POA: Insufficient documentation

## 2023-05-28 NOTE — PT Treatment (Addendum)
[facility] Medicine [facility]  Outpatient Physical Therapy  [address]  [place], [address]  [phone]  (Fax) [phone]    Physical Therapy Treatment Note/Discharge Summary    Date: 05/28/2023  Patient's Name: Sandra Carlson  Date of Birth: 1966-07-16  Physical Therapy Visit      Visit #/POC: 21/21  Authorization:20 fy (more auth req 05/20/2023)  POC Signed?: no (new order 05/07/2023)  POC Ends: 06/01/2023        Evaluating Physical Therapist: Shirlean Schlein  PT diagnosis/Reason for Referral: R supraspinatus repair 02/12/2023  Allergies/Contraindications: 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 She notes that she is back to doing all daily activities with no issues.      Patient-Specific Functional Score:     Problem Score 05/28/23   1. Lifting a gallon of milk from the fridge to a counter top 6 10   2. Reaching watering can OH to water plants 5 10   3. Sleeping through the night in bed 10 10   TOTAL 7 10         Objective: Warm up on UBE, there ex per flow sheet as noted below.      Shoulder AROM/Strength    R GH AROM R GH STRENGTH R GH AROM  05/28/23 R GH Strength   Flexion 170  4 170 4+   Extension 55 4  4+   Abduction 160 4 160 4+   ER C2 4 C7 4+   IR L1 4 T10 4+              EXERCISE/ACTIVITY NAME REPETITIONS RESISTANCE COMPLETED THIS DOS   Ube FW/BW    6' - y   Horizontal pull aparts 2 x 12 green y   90/90 ER shoulder flexion 2 x 10  red y   Face pulls 3 x 10  red y   OH press 2 x 10 1# with mirror BF n   ER    2 x 10  green y   PD, Rows 3 x 15 ea Black y   High ball bounce 3 x 30 sec  yellow PB y   Wall push up 2 x 10  elbows tucked y   Mudlogger stretch 10x 3 sec hold   y   Body blade  2 x 30 sec   y   Walk outs  2 x 10  red y      Access Code: [email or web address]     Assessment:  Pt has met all goals at this time and is ready for d/c to HEP at this  time.  Short term goals (4 weeks):  -Pt will be pain free without sling during ADLs and work (MET 05/02/2023)  -Pt will be able to drive and apply seatbelt to improve her ability to go to work (MET 04/08/2023)  -Pt will be able to sleep through the night (MET 04/08/2023)  -Pt will demo at least 120 deg of R shoulder flexion AROM and PROM to demo readiness to progress to next phase of protocol (MET 05/02/2023)        Long term goals (8 weeks):  -Pt will demo full and pain free R shoulder AROM without compensations to improve her ability to reach [facility].  (MET 05/28/2023)  -Pt will be able to lift 10 pounds from waist  ht to [facility] without pain to improve her ability to water her flowers. (MET 05/28/2023)  -Pt will score at least 7 on PSFS to progress towards pt stated goals. (MET 05/20/2023)  -Pt will become IND and compliant with HEP to optimize postoperative function and reduce risk of reinjury. (MET 05/02/2023)   Plan: This is pt's last visit, please see PT note for plan.            Total Session Time 38 and Timed code minutes 38  THERAPEUTIC EXERCISE 38 minutes    Leah Bragg, PTA 05/28/2023 16:29      **Discharge Summary:  pt has achieved all PT goals at this time. She demos functional strength and mobility throughout R shoulder, and she is not restricted in her ability to participate in ADLs or work related activities. She will continue to improve R shoulder strength and endurance with HEP only at this time. She was encouraged to call back with any further needs or concerns. Shirlean Schlein, PT 05/28/2023 18:23

## 2023-06-27 ENCOUNTER — Other Ambulatory Visit: Payer: Self-pay

## 2023-06-27 ENCOUNTER — Encounter (INDEPENDENT_AMBULATORY_CARE_PROVIDER_SITE_OTHER): Payer: Self-pay | Admitting: PHYSICIAN ASSISTANT

## 2023-06-27 ENCOUNTER — Ambulatory Visit (INDEPENDENT_AMBULATORY_CARE_PROVIDER_SITE_OTHER): Payer: 59 | Admitting: PHYSICIAN ASSISTANT

## 2023-06-27 VITALS — BP 149/96 | HR 86 | Temp 97.8°F | Ht 65.0 in | Wt 278.0 lb

## 2023-06-27 DIAGNOSIS — K219 Gastro-esophageal reflux disease without esophagitis: Secondary | ICD-10-CM | POA: Insufficient documentation

## 2023-06-27 DIAGNOSIS — M7032 Other bursitis of elbow, left elbow: Secondary | ICD-10-CM

## 2023-06-27 NOTE — Progress Notes (Signed)
FAMILY MEDICINE, MEDICAL OFFICE BUILDING  [address]  [place] [address]      Ramata Strothman  1966-08-24  [id number]    Date of Service: 06/27/2023  4:00 PM EDT    Chief complaint:   Chief Complaint   Patient presents with    Elbow Swelling     Pt states for 2months left elbow has had a fluid filled bursitis - no pain        Subjective:      57 y.o. year old female who comes in today for evaluation of her elbow. She has swelling of her left elbow x 2 months. She states her ortho she sees for her shoulder told her it was bursitis and advised her to wrap it but she has seen no improvement with the wrapping. She denies pain. States slightly red and warm at times.     ROS:  Reviewed as negative unless otherwise mentioned in the HPI.    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    Hearing loss in left ear 04/09/2010    Hypothyroidism 04/09/2010       Past Surgical History:   Procedure Laterality Date    ANKLE SURGERY      HX OTHER      HX TONSILLECTOMY      HX TUBAL LIGATION      LAPAROSCOPIC CHOLECYSTECTOMY      SHOULDER SURGERY Right            Current Outpatient Medications   Medication Sig    Colestipol (COLESTID) 1 gram Oral Tablet Take 1 Tablet (1 g total) by mouth Twice per day as needed    escitalopram oxalate (LEXAPRO) 10 mg Oral Tablet Take 1 Tablet (10 mg total) by mouth Once a day    FEROSUL 325 mg (65 mg iron) Oral Tablet     furosemide (LASIX) 20 mg Oral Tablet     levothyroxine (SYNTHROID) 25 mcg Tab Take 1 Tablet (25 mcg total) by mouth Once a day    omeprazole (PRILOSEC) 20 mg Oral Capsule, Delayed Release(E.C.)     potassium chloride (K-DUR) 10 mEq Oral Tab Sust.Rel. Particle/Crystal     valsartan (DIOVAN) 320 mg Oral Tablet        Objective:     BP (!) 149/96   Pulse 86   Temp 36.6 C (97.8 F) (Temporal)   Ht 1.651 m (5\' 5" )   Wt 126 kg (278 lb)    SpO2 97%   BMI 46.26 kg/m      General appearance: alert, cooperative, in no acute distress  Head: normocephalic, atraumatic.   Eyes: PERRL, EOMI.   Neck: no LAD or thyromegaly, supple.  Lungs: clear to auscultation bilaterally; no wheezes or rhonchi   Heart: regular rate and rhythm; no murmur  Extremities: intact x 4. She does have a bursitis of the left elbow. Fluid filled area that is non-tender with mild erythema and increased warmth.     Assessment/Plan     Assessment & Plan  Bursitis of left elbow  - Will refer back to ortho for aspiration. She is agreeable to [facility] referral. Recommend compressive bandages. If developing pain or increased redness and warmth f/u sooner.  Orders:    Referral to External Provider            The  patient was given the opportunity to ask questions and those questions were answered to the patient's satisfaction. The patient was encouraged to call with any additional questions or concerns. Instructed patient to call back if symptoms worse.     Follow up: Return if symptoms worsen or fail to improve.    Bonner Puna, PA-C

## 2023-06-27 NOTE — Nursing Note (Signed)
06/27/23 1606   Depression Screen   Little interest or pleasure in doing things. 0   Feeling down, depressed, or hopeless 0   PHQ 2 Total 0
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
## 2023-07-03 ENCOUNTER — Other Ambulatory Visit: Payer: Self-pay

## 2023-08-18 ENCOUNTER — Other Ambulatory Visit: Payer: Self-pay

## 2023-11-04 ENCOUNTER — Other Ambulatory Visit: Payer: Self-pay

## 2023-11-04 ENCOUNTER — Encounter (INDEPENDENT_AMBULATORY_CARE_PROVIDER_SITE_OTHER): Payer: Self-pay | Admitting: PHYSICIAN ASSISTANT

## 2023-11-04 ENCOUNTER — Ambulatory Visit (INDEPENDENT_AMBULATORY_CARE_PROVIDER_SITE_OTHER): Admitting: PHYSICIAN ASSISTANT

## 2023-11-04 ENCOUNTER — Other Ambulatory Visit: Attending: PHYSICIAN ASSISTANT | Admitting: PHYSICIAN ASSISTANT

## 2023-11-04 VITALS — BP 135/94 | HR 89 | Temp 98.4°F | Ht 65.0 in | Wt 273.0 lb

## 2023-11-04 DIAGNOSIS — D509 Iron deficiency anemia, unspecified: Secondary | ICD-10-CM | POA: Insufficient documentation

## 2023-11-04 DIAGNOSIS — F419 Anxiety disorder, unspecified: Secondary | ICD-10-CM

## 2023-11-04 DIAGNOSIS — I10 Essential (primary) hypertension: Secondary | ICD-10-CM

## 2023-11-04 DIAGNOSIS — R600 Localized edema: Secondary | ICD-10-CM

## 2023-11-04 DIAGNOSIS — E039 Hypothyroidism, unspecified: Secondary | ICD-10-CM | POA: Insufficient documentation

## 2023-11-04 DIAGNOSIS — K219 Gastro-esophageal reflux disease without esophagitis: Secondary | ICD-10-CM

## 2023-11-04 LAB — COMPREHENSIVE METABOLIC PNL, FASTING
ALBUMIN/GLOBULIN RATIO: 1.5 — ABNORMAL HIGH (ref 0.8–1.4)
ALBUMIN: 4.1 g/dL (ref 3.5–5.7)
ALKALINE PHOSPHATASE: 100 U/L (ref 34–104)
ALT (SGPT): 27 U/L (ref 7–52)
ANION GAP: 8 mmol/L (ref 4–13)
AST (SGOT): 22 U/L (ref 13–39)
BILIRUBIN TOTAL: 0.3 mg/dL (ref 0.3–1.0)
BUN/CREA RATIO: 17 (ref 6–22)
BUN: 16 mg/dL (ref 7–25)
CALCIUM, CORRECTED: 8.8 mg/dL — ABNORMAL LOW (ref 8.9–10.8)
CALCIUM: 8.9 mg/dL (ref 8.6–10.3)
CHLORIDE: 109 mmol/L — ABNORMAL HIGH (ref 98–107)
CO2 TOTAL: 23 mmol/L (ref 21–31)
CREATININE: 0.94 mg/dL (ref 0.60–1.30)
ESTIMATED GFR: 70 mL/min/{1.73_m2} (ref >59)
GLOBULIN: 2.8 (ref 2.0–3.5)
GLUCOSE: 108 mg/dL (ref 74–109)
OSMOLALITY, CALCULATED: 281 mosm/kg (ref 270–290)
POTASSIUM: 3.5 mmol/L (ref 3.5–5.1)
PROTEIN TOTAL: 6.9 g/dL (ref 6.4–8.9)
SODIUM: 140 mmol/L (ref 136–145)

## 2023-11-04 LAB — CBC WITH DIFF
BASOPHIL #: 0.1 10*3/uL (ref 0.00–0.10)
BASOPHIL %: 1 % (ref 0–1)
EOSINOPHIL #: 0.2 10*3/uL (ref 0.00–0.50)
EOSINOPHIL %: 3 % (ref 1–7)
HCT: 44.2 % — ABNORMAL HIGH (ref 31.2–41.9)
HGB: 14.7 g/dL — ABNORMAL HIGH (ref 10.9–14.3)
LYMPHOCYTE #: 2.1 10*3/uL (ref 1.10–3.10)
LYMPHOCYTE %: 28 % (ref 16–46)
MCH: 28.5 pg (ref 24.7–32.8)
MCHC: 33.4 g/dL (ref 32.3–35.6)
MCV: 85.4 fL (ref 75.5–95.3)
MONOCYTE #: 0.6 10*3/uL (ref 0.20–0.90)
MONOCYTE %: 8 % (ref 4–11)
MPV: 8.9 fL (ref 7.9–10.8)
NEUTROPHIL #: 4.6 10*3/uL (ref 1.90–8.20)
NEUTROPHIL %: 60 % (ref 43–77)
PLATELETS: 244 10*3/uL (ref 140–440)
RBC: 5.17 10*6/uL — ABNORMAL HIGH (ref 3.63–4.92)
RDW: 14.2 % (ref 12.3–17.7)
WBC: 7.6 10*3/uL (ref 3.8–11.8)

## 2023-11-04 LAB — IRON TRANSFERRIN AND TIBC
IRON (TRANSFERRIN) SATURATION: 17 % (ref 15–50)
IRON: 46 ug/dL — ABNORMAL LOW (ref 50–212)
TOTAL IRON BINDING CAPACITY: 276 ug/dL (ref 250–450)
TRANSFERRIN: 197 mg/dL — ABNORMAL LOW (ref 203–362)
UIBC: 230 ug/dL (ref 130–375)

## 2023-11-04 LAB — THYROID STIMULATING HORMONE (SENSITIVE TSH): TSH: 2.74 u[IU]/mL (ref 0.450–5.330)

## 2023-11-04 MED ORDER — LEVOTHYROXINE 25 MCG TABLET
25.0000 ug | ORAL_TABLET | Freq: Every day | ORAL | 0 refills | Status: DC
Start: 2023-11-04 — End: 2024-02-04

## 2023-11-04 MED ORDER — FEROSUL 325 MG (65 MG IRON) TABLET
325.0000 mg | ORAL_TABLET | Freq: Every day | ORAL | 0 refills | Status: DC
Start: 2023-11-04 — End: 2024-02-04

## 2023-11-04 MED ORDER — POTASSIUM CHLORIDE ER 10 MEQ TABLET,EXTENDED RELEASE(PART/CRYST)
10.0000 meq | ORAL_TABLET | Freq: Every day | ORAL | 0 refills | Status: DC
Start: 2023-11-04 — End: 2024-02-04

## 2023-11-04 MED ORDER — VALSARTAN 320 MG TABLET
320.0000 mg | ORAL_TABLET | Freq: Every day | ORAL | 0 refills | Status: DC
Start: 2023-11-04 — End: 2024-02-04

## 2023-11-04 MED ORDER — ESCITALOPRAM 10 MG TABLET
10.0000 mg | ORAL_TABLET | Freq: Every day | ORAL | 0 refills | Status: DC
Start: 2023-11-04 — End: 2024-02-04

## 2023-11-04 MED ORDER — FUROSEMIDE 20 MG TABLET
20.0000 mg | ORAL_TABLET | Freq: Every day | ORAL | 0 refills | Status: DC
Start: 2023-11-04 — End: 2024-02-04

## 2023-11-04 MED ORDER — OMEPRAZOLE 20 MG CAPSULE,DELAYED RELEASE
20.0000 mg | DELAYED_RELEASE_CAPSULE | Freq: Every day | ORAL | 0 refills | Status: DC
Start: 2023-11-04 — End: 2024-02-04

## 2023-11-04 MED ORDER — COLESTIPOL 1 GRAM TABLET
1.0000 g | ORAL_TABLET | Freq: Two times a day (BID) | ORAL | 0 refills | Status: DC | PRN
Start: 2023-11-04 — End: 2024-02-04

## 2023-11-04 NOTE — Progress Notes (Signed)
 [facility] OFFICE [facility]  FAMILY MEDICINE, MEDICAL OFFICE BUILDING  [address]  [place] [address]  [phone]   Sandra Carlson  July 15, 1966  [id number]    Date of Service: 11/04/2023  Chief complaint:   Chief Complaint   Patient presents with    Follow Up     Needs med refill      Subjective:   Sandra Carlson is a 58 y.o. female and presents today for follow-up on chronic disease management. Needs medication refills. She has increased stress with her mother's health where she had a stroke and is bedfast. She had flu after her mom's recent hospital discharge but is finally feeling better.   She had her elbow drained with ortho and it is doing better.   Denies chest pain, heart flutters, dizziness, nausea, vomiting, diarrhea, constipation, edema.     Past Medical History:   Diagnosis Date    Acoustic neuroma (CMS HCC)     Allergic rhinitis     Cancer (CMS HCC)     SKIN    Essential hypertension     Hearing loss     Sleep apnea     Thyroid disease        Past Surgical History:   Procedure Laterality Date    ANKLE SURGERY      HX OTHER      HX TONSILLECTOMY      HX TUBAL LIGATION      LAPAROSCOPIC CHOLECYSTECTOMY      SHOULDER SURGERY Right        Current Outpatient Medications   Medication Sig Dispense Refill    Colestipol (COLESTID) 1 gram Oral Tablet Take 1 Tablet (1 g total) by mouth Twice per day as needed for up to 90 days 180 Tablet 0    escitalopram oxalate (LEXAPRO) 10 mg Oral Tablet Take 1 Tablet (10 mg total) by mouth Once a day for 90 days 90 Tablet 0    FEROSUL 325 mg (65 mg iron) Oral Tablet Take 1 Tablet (325 mg total) by mouth Once a day for 90 days 90 Tablet 0    furosemide (LASIX) 20 mg Oral Tablet Take 1 Tablet (20 mg total) by mouth Once a day for 90 days 90 Tablet 0    levothyroxine (SYNTHROID) 25 mcg Oral Tablet Take 1 Tablet (25 mcg total) by mouth Once a day for 90 days 90 Tablet 0    omeprazole (PRILOSEC) 20 mg Oral Capsule, Delayed Release(E.C.) Take 1 Capsule (20 mg total) by  mouth Once a day for 90 days 90 Capsule 0    potassium chloride (K-DUR) 10 mEq Oral Tab Sust.Rel. Particle/Crystal Take 1 Tablet (10 mEq total) by mouth Once a day for 90 days 90 Tablet 0    valsartan (DIOVAN) 320 mg Oral Tablet Take 1 Tablet (320 mg total) by mouth Once a day for 90 days 90 Tablet 0     No current facility-administered medications for this visit.     Allergies   Allergen Reactions    Augmentin [Amoxicillin-Pot Clavulanate]     Clindamycin     Quinolones      Other reaction(s): Chest Pain     Review of Systems:  Any pertinent Review of Systems as addressed in the HPI above.    Objective:   BP (!) 135/94   Pulse 89   Temp 36.9 C (98.4 F)   Ht 1.651 m (5\' 5" )   Wt 124 kg (273 lb)   SpO2 95%  BMI 45.43 kg/m       BMI addressed: Advised on diet, weight loss, and exercise to reduce above normal BMI.       Constitutional: No acute distress. Alert and oriented.  Head: Normocephalic, atraumatic.  Eyes: Clear. PERRL. EOMI.  Ears: Canals and TMs clear and intact bilaterally.  Nose: Patent. No rhinorrhea.  Throat: Clear, no erythema or drainge.  Neck: Supple. No lymphadenopathy. No bruit.   Lungs: Clear to auscultation bilaterally. No wheezes, rhonchi, or rales.  Heart: Regular rate and rhythm. No murmurs.  Abdomen: Soft, non-tender, nondistended. BS active.  Extremities: Intact x 4. 1+ pitting edema on the R. Gait stable.  Neuro: CN 2-12 grossly intact.   Psych: Mood and affect appropriate and congruent.    Laboratory:  COMPLETE BLOOD COUNT   Lab Results   Component Value Date    WBC 7.6 08/12/2022    HGB 16.2 (H) 08/12/2022    HCT 48.0 (H) 08/12/2022    PLTCNT 251 08/12/2022       DIFFERENTIAL  Lab Results   Component Value Date    PMNS 62 08/12/2022    LYMPHOCYTES 27 08/12/2022    MONOCYTES 7 08/12/2022    EOSINOPHIL 3 08/12/2022    BASOPHILS 1 08/12/2022    BASOPHILS 0.10 08/12/2022    PMNABS 4.70 08/12/2022    LYMPHSABS 2.00 08/12/2022    EOSABS 0.20 08/12/2022    MONOSABS 0.50 08/12/2022      BASIC METABOLIC PANEL  Lab Results   Component Value Date    SODIUM 142 05/16/2022    POTASSIUM 4.4 05/16/2022    CHLORIDE 110 (H) 05/16/2022    CO2 25 05/16/2022    ANIONGAP 7 05/16/2022    BUN 17 05/16/2022    CREATININE 0.95 05/16/2022    BUNCRRATIO 18 05/16/2022    GFR 70 05/16/2022    CALCIUM 9.0 05/16/2022    GLUCOSENF 103 05/16/2022         Most recent lab results reviewed with patient.    Assessment & Plan  Hypothyroidism, unspecified type    Orders:    THYROID STIMULATING HORMONE (SENSITIVE TSH); Future    levothyroxine (SYNTHROID) 25 mcg Oral Tablet; Take 1 Tablet (25 mcg total) by mouth Once a day for 90 days    Gastroesophageal reflux disease, unspecified whether esophagitis present    Orders:    omeprazole (PRILOSEC) 20 mg Oral Capsule, Delayed Release(E.C.); Take 1 Capsule (20 mg total) by mouth Once a day for 90 days    Hypertension, unspecified type    Orders:    COMPREHENSIVE METABOLIC PNL, FASTING; Future    valsartan (DIOVAN) 320 mg Oral Tablet; Take 1 Tablet (320 mg total) by mouth Once a day for 90 days    Iron deficiency anemia    Orders:    CBC/DIFF; Future    FEROSUL 325 mg (65 mg iron) Oral Tablet; Take 1 Tablet (325 mg total) by mouth Once a day for 90 days    IRON TRANSFERRIN AND TIBC; Future    Anxiety    Orders:    escitalopram oxalate (LEXAPRO) 10 mg Oral Tablet; Take 1 Tablet (10 mg total) by mouth Once a day for 90 days    Edema, peripheral    Orders:    furosemide (LASIX) 20 mg Oral Tablet; Take 1 Tablet (20 mg total) by mouth Once a day for 90 days    Other orders    potassium chloride (K-DUR) 10 mEq Oral Tab Sust.Rel. Particle/Crystal; Take  1 Tablet (10 mEq total) by mouth Once a day for 90 days    Colestipol (COLESTID) 1 gram Oral Tablet; Take 1 Tablet (1 g total) by mouth Twice per day as needed for up to 90 days      Counseling with regards to preventative care given. Medication summary was discussed with the patient to verify compliance and understanding. Medication safety  was discussed. A good faith effort was made to reconcile the patient's medications.     The patient was given the opportunity to ask questions and those questions were answered to the patient's satisfaction. The patient was encouraged to call with any additional questions or concerns. More than 50% of the visit was spent counseling and coordinating care.    Orders Placed This Encounter    CBC/DIFF    COMPREHENSIVE METABOLIC PNL, FASTING    THYROID STIMULATING HORMONE (SENSITIVE TSH)    IRON TRANSFERRIN AND TIBC    omeprazole (PRILOSEC) 20 mg Oral Capsule, Delayed Release(E.C.)    potassium chloride (K-DUR) 10 mEq Oral Tab Sust.Rel. Particle/Crystal    levothyroxine (SYNTHROID) 25 mcg Oral Tablet    furosemide (LASIX) 20 mg Oral Tablet    FEROSUL 325 mg (65 mg iron) Oral Tablet    escitalopram oxalate (LEXAPRO) 10 mg Oral Tablet    Colestipol (COLESTID) 1 gram Oral Tablet    valsartan (DIOVAN) 320 mg Oral Tablet                  Return in about 3 months (around 02/04/2024).  [address], PA-C 11/04/2023, 15:33

## 2023-11-04 NOTE — Assessment & Plan Note (Addendum)
 Orders:    omeprazole (PRILOSEC) 20 mg Oral Capsule, Delayed Release(E.C.); Take 1 Capsule (20 mg total) by mouth Once a day for 90 days

## 2023-11-04 NOTE — Assessment & Plan Note (Addendum)
 Orders:    THYROID STIMULATING HORMONE (SENSITIVE TSH); Future    levothyroxine (SYNTHROID) 25 mcg Oral Tablet; Take 1 Tablet (25 mcg total) by mouth Once a day for 90 days
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
## 2023-11-05 ENCOUNTER — Ambulatory Visit (INDEPENDENT_AMBULATORY_CARE_PROVIDER_SITE_OTHER): Payer: Self-pay | Admitting: PHYSICIAN ASSISTANT

## 2024-02-04 ENCOUNTER — Other Ambulatory Visit: Payer: Self-pay

## 2024-02-04 ENCOUNTER — Ambulatory Visit: Payer: Self-pay | Attending: PHYSICIAN ASSISTANT | Admitting: PHYSICIAN ASSISTANT

## 2024-02-04 ENCOUNTER — Encounter (INDEPENDENT_AMBULATORY_CARE_PROVIDER_SITE_OTHER): Payer: Self-pay | Admitting: PHYSICIAN ASSISTANT

## 2024-02-04 VITALS — BP 130/82 | HR 86 | Temp 98.3°F | Resp 16 | Ht 65.0 in | Wt 273.0 lb

## 2024-02-04 DIAGNOSIS — K219 Gastro-esophageal reflux disease without esophagitis: Secondary | ICD-10-CM | POA: Insufficient documentation

## 2024-02-04 DIAGNOSIS — D333 Benign neoplasm of cranial nerves: Secondary | ICD-10-CM | POA: Insufficient documentation

## 2024-02-04 DIAGNOSIS — D509 Iron deficiency anemia, unspecified: Secondary | ICD-10-CM | POA: Insufficient documentation

## 2024-02-04 DIAGNOSIS — I10 Essential (primary) hypertension: Secondary | ICD-10-CM | POA: Insufficient documentation

## 2024-02-04 DIAGNOSIS — E039 Hypothyroidism, unspecified: Secondary | ICD-10-CM | POA: Insufficient documentation

## 2024-02-04 DIAGNOSIS — F419 Anxiety disorder, unspecified: Secondary | ICD-10-CM | POA: Insufficient documentation

## 2024-02-04 DIAGNOSIS — Z1159 Encounter for screening for other viral diseases: Secondary | ICD-10-CM | POA: Insufficient documentation

## 2024-02-04 DIAGNOSIS — R197 Diarrhea, unspecified: Secondary | ICD-10-CM | POA: Insufficient documentation

## 2024-02-04 DIAGNOSIS — R600 Localized edema: Secondary | ICD-10-CM | POA: Insufficient documentation

## 2024-02-04 DIAGNOSIS — Z1239 Encounter for other screening for malignant neoplasm of breast: Secondary | ICD-10-CM

## 2024-02-04 DIAGNOSIS — Z114 Encounter for screening for human immunodeficiency virus [HIV]: Secondary | ICD-10-CM | POA: Insufficient documentation

## 2024-02-04 LAB — COMPREHENSIVE METABOLIC PNL, FASTING
ALBUMIN/GLOBULIN RATIO: 1.5 — ABNORMAL HIGH (ref 0.8–1.4)
ALBUMIN: 4.3 g/dL (ref 3.5–5.7)
ALKALINE PHOSPHATASE: 83 U/L (ref 34–104)
ALT (SGPT): 46 U/L (ref 7–52)
ANION GAP: 8 mmol/L (ref 4–13)
AST (SGOT): 37 U/L (ref 13–39)
BILIRUBIN TOTAL: 0.4 mg/dL (ref 0.3–1.0)
BUN/CREA RATIO: 15 (ref 6–22)
BUN: 16 mg/dL (ref 7–25)
CALCIUM, CORRECTED: 8.8 mg/dL — ABNORMAL LOW (ref 8.9–10.8)
CALCIUM: 9 mg/dL (ref 8.6–10.3)
CHLORIDE: 108 mmol/L — ABNORMAL HIGH (ref 98–107)
CO2 TOTAL: 23 mmol/L (ref 21–31)
CREATININE: 1.05 mg/dL (ref 0.60–1.30)
ESTIMATED GFR: 62 mL/min/{1.73_m2} (ref >59)
GLOBULIN: 2.9 (ref 2.0–3.5)
GLUCOSE: 107 mg/dL (ref 74–109)
OSMOLALITY, CALCULATED: 279 mosm/kg (ref 270–290)
POTASSIUM: 3.3 mmol/L — ABNORMAL LOW (ref 3.5–5.1)
PROTEIN TOTAL: 7.2 g/dL (ref 6.4–8.9)
SODIUM: 139 mmol/L (ref 136–145)

## 2024-02-04 LAB — IRON TRANSFERRIN AND TIBC
IRON (TRANSFERRIN) SATURATION: 11 % — ABNORMAL LOW (ref 15–50)
IRON: 31 ug/dL — ABNORMAL LOW (ref 50–212)
TOTAL IRON BINDING CAPACITY: 288 ug/dL (ref 250–450)
TRANSFERRIN: 206 mg/dL (ref 203–362)
UIBC: 257 ug/dL (ref 130–375)

## 2024-02-04 LAB — CBC WITH DIFF
BASOPHIL #: 0.1 10*3/uL (ref 0.00–0.10)
BASOPHIL %: 1 % (ref 0–1)
EOSINOPHIL #: 0.2 10*3/uL (ref 0.00–0.50)
EOSINOPHIL %: 3 % (ref 1–7)
HCT: 46.3 % — ABNORMAL HIGH (ref 31.2–41.9)
HGB: 15.8 g/dL — ABNORMAL HIGH (ref 10.9–14.3)
LYMPHOCYTE #: 1.8 10*3/uL (ref 1.10–3.10)
LYMPHOCYTE %: 27 % (ref 16–46)
MCH: 28.6 pg (ref 24.7–32.8)
MCHC: 34.1 g/dL (ref 32.3–35.6)
MCV: 83.9 fL (ref 75.5–95.3)
MONOCYTE #: 0.7 10*3/uL (ref 0.20–0.90)
MONOCYTE %: 10 % (ref 4–11)
MPV: 8.8 fL (ref 7.9–10.8)
NEUTROPHIL #: 4 10*3/uL (ref 1.90–8.20)
NEUTROPHIL %: 59 % (ref 43–77)
PLATELETS: 236 10*3/uL (ref 140–440)
RBC: 5.52 10*6/uL — ABNORMAL HIGH (ref 3.63–4.92)
RDW: 14.4 % (ref 12.3–17.7)
WBC: 6.8 10*3/uL (ref 3.8–11.8)

## 2024-02-04 LAB — HIV 1 AND 2 RAPID SCREEN
HIV-1/2 ANTIBODY SCREEN: NONREACTIVE
HIV1-p24 ANTIGEN SCREEN: NONREACTIVE

## 2024-02-04 LAB — THYROID STIMULATING HORMONE (SENSITIVE TSH): TSH: 4.834 u[IU]/mL (ref 0.450–5.330)

## 2024-02-04 LAB — MAGNESIUM: MAGNESIUM: 2 mg/dL (ref 1.9–2.7)

## 2024-02-04 MED ORDER — FUROSEMIDE 20 MG TABLET
20.0000 mg | ORAL_TABLET | Freq: Every day | ORAL | 0 refills | Status: DC
Start: 2024-02-04 — End: 2024-05-07

## 2024-02-04 MED ORDER — LEVOTHYROXINE 25 MCG TABLET
25.0000 ug | ORAL_TABLET | Freq: Every day | ORAL | 0 refills | Status: DC
Start: 2024-02-04 — End: 2024-05-07

## 2024-02-04 MED ORDER — COLESTIPOL 1 GRAM TABLET
1.0000 g | ORAL_TABLET | Freq: Two times a day (BID) | ORAL | 0 refills | Status: DC | PRN
Start: 2024-02-04 — End: 2024-05-07

## 2024-02-04 MED ORDER — VALSARTAN 320 MG TABLET
320.0000 mg | ORAL_TABLET | Freq: Every day | ORAL | 0 refills | Status: DC
Start: 2024-02-04 — End: 2024-05-07

## 2024-02-04 MED ORDER — OMEPRAZOLE 20 MG CAPSULE,DELAYED RELEASE
20.0000 mg | DELAYED_RELEASE_CAPSULE | Freq: Every day | ORAL | 0 refills | Status: DC
Start: 2024-02-04 — End: 2024-05-07

## 2024-02-04 MED ORDER — FEROSUL 325 MG (65 MG IRON) TABLET
325.0000 mg | ORAL_TABLET | Freq: Every day | ORAL | 0 refills | Status: DC
Start: 2024-02-04 — End: 2024-05-04

## 2024-02-04 MED ORDER — ESCITALOPRAM 10 MG TABLET
10.0000 mg | ORAL_TABLET | Freq: Every day | ORAL | 0 refills | Status: DC
Start: 2024-02-04 — End: 2024-05-07

## 2024-02-04 MED ORDER — POTASSIUM CHLORIDE ER 10 MEQ TABLET,EXTENDED RELEASE(PART/CRYST)
10.0000 meq | ORAL_TABLET | Freq: Every day | ORAL | 0 refills | Status: DC
Start: 2024-02-04 — End: 2024-05-04

## 2024-02-04 NOTE — Progress Notes (Signed)
 PRN MEDICAL OFFICE [facility]  FAMILY MEDICINE, MEDICAL OFFICE BUILDING  [address]  [place] [address]  [phone]   Sandra Carlson  03-Jul-1966  [id number]    Date of Service: 02/04/2024  Chief complaint:   Chief Complaint   Patient presents with    Follow Up 3 Months     Patient is here for CDM.    Diarrhea     Patient complains of diarrhea that started Sunday, she reports she is also nausea. She denies blood in stool, or abdominal pain.      Subjective:   Sandra Carlson is a 58 y.o. female and presents today for follow-up on chronic disease management and medication refills.   She c/o diarrhea and vomiting x 3 days which did resolve with pepto and colestid . Denies abdominal pain, blood in stools, fever, chills. She notes she was with her dad in the ER over the weekend and she thinks she may have picked up something from the hospital. She is having lingering fatigue. Hasn't been taking her regular meds secondary to illness.    Denies chest pain, SOB, heart flutters, and dizziness.     Past Medical History:   Diagnosis Date    Acoustic neuroma (CMS HCC)     Allergic rhinitis     Cancer (CMS HCC)     SKIN    Essential hypertension     Hearing loss     Sleep apnea     Thyroid  disease          Past Surgical History:   Procedure Laterality Date    ANKLE SURGERY      HX OTHER      HX TONSILLECTOMY      HX TUBAL LIGATION      LAPAROSCOPIC CHOLECYSTECTOMY      SHOULDER SURGERY Right          Current Outpatient Medications   Medication Sig Dispense Refill    Colestipol  (COLESTID ) 1 gram Oral Tablet Take 1 Tablet (1 g total) by mouth Twice per day as needed for up to 90 days 180 Tablet 0    escitalopram  oxalate (LEXAPRO ) 10 mg Oral Tablet Take 1 Tablet (10 mg total) by mouth Daily for 90 days 90 Tablet 0    FEROSUL 325 mg (65 mg iron ) Oral Tablet Take 1 Tablet (325 mg total) by mouth Daily for 90 days 90 Tablet 0    furosemide  (LASIX ) 20 mg Oral Tablet Take 1 Tablet (20 mg total) by mouth Daily for 90 days 90  Tablet 0    levothyroxine  (SYNTHROID) 25 mcg Oral Tablet Take 1 Tablet (25 mcg total) by mouth Daily for 90 days 90 Tablet 0    omeprazole  (PRILOSEC) 20 mg Oral Capsule, Delayed Release(E.C.) Take 1 Capsule (20 mg total) by mouth Daily for 90 days 90 Capsule 0    potassium chloride  (K-DUR) 10 mEq Oral Tab Sust.Rel. Particle/Crystal Take 1 Tablet (10 mEq total) by mouth Daily for 90 days 90 Tablet 0    valsartan  (DIOVAN ) 320 mg Oral Tablet Take 1 Tablet (320 mg total) by mouth Daily for 90 days 90 Tablet 0     No current facility-administered medications for this visit.     Allergies[1]  Review of Systems:  Any pertinent Review of Systems as addressed in the HPI above.    Objective:   BP 130/82   Pulse 86   Temp 36.8 C (98.3 F) (Temporal)   Resp 16   Ht 1.651 m (5'  5)   Wt 124 kg (273 lb)   SpO2 97%   BMI 45.43 kg/m       BMI addressed: Advised on diet, weight loss, and exercise to reduce above normal BMI.       Constitutional: No acute distress. Alert and oriented.  Head: Normocephalic, atraumatic.  Eyes: Clear. PERRL. EOMI.  Ears: Canals and TMs clear and intact bilaterally.  Nose: Patent. No rhinorrhea.  Throat: Clear, no erythema or drainge.  Neck: Supple. No lymphadenopathy.   Lungs: Clear to auscultation bilaterally. No wheezes, rhonchi, or rales.  Heart: Regular rate and rhythm. No murmurs.  Abdomen: Soft, non-tender, nondistended. BS active.  Extremities: Intact x 4. No edema. Gait stable.  Neuro: CN 2-12 grossly intact.   Psych: Mood and affect appropriate and congruent.    Laboratory:  COMPLETE BLOOD COUNT   Lab Results   Component Value Date    WBC 7.6 11/04/2023    HGB 14.7 (H) 11/04/2023    HCT 44.2 (H) 11/04/2023    PLTCNT 244 11/04/2023       DIFFERENTIAL  Lab Results   Component Value Date    PMNS 60 11/04/2023    LYMPHOCYTES 28 11/04/2023    MONOCYTES 8 11/04/2023    EOSINOPHIL 3 11/04/2023    BASOPHILS 1 11/04/2023    BASOPHILS 0.10 11/04/2023    PMNABS 4.60 11/04/2023    LYMPHSABS 2.10  11/04/2023    EOSABS 0.20 11/04/2023    MONOSABS 0.60 11/04/2023     BASIC METABOLIC PANEL  Lab Results   Component Value Date    SODIUM 140 11/04/2023    POTASSIUM 3.5 11/04/2023    CHLORIDE 109 (H) 11/04/2023    CO2 23 11/04/2023    ANIONGAP 8 11/04/2023    BUN 16 11/04/2023    CREATININE 0.94 11/04/2023    BUNCRRATIO 17 11/04/2023    GFR 70 11/04/2023    CALCIUM 8.9 11/04/2023    GLUCOSENF 108 11/04/2023         Most recent lab results reviewed with patient.    Assessment & Plan  Anxiety    Orders:    escitalopram  oxalate (LEXAPRO ) 10 mg Oral Tablet; Take 1 Tablet (10 mg total) by mouth Daily for 90 days    Iron  deficiency anemia    Orders:    FEROSUL 325 mg (65 mg iron ) Oral Tablet; Take 1 Tablet (325 mg total) by mouth Daily for 90 days    CBC/DIFF; Future    IRON  TRANSFERRIN AND TIBC; Future    Edema, peripheral    Orders:    furosemide  (LASIX ) 20 mg Oral Tablet; Take 1 Tablet (20 mg total) by mouth Daily for 90 days    Hypothyroidism, unspecified type    Orders:    levothyroxine  (SYNTHROID) 25 mcg Oral Tablet; Take 1 Tablet (25 mcg total) by mouth Daily for 90 days    THYROID  STIMULATING HORMONE (SENSITIVE TSH); Future    Gastroesophageal reflux disease, unspecified whether esophagitis present    Orders:    omeprazole  (PRILOSEC) 20 mg Oral Capsule, Delayed Release(E.C.); Take 1 Capsule (20 mg total) by mouth Daily for 90 days    Hypertension, unspecified type    Orders:    valsartan  (DIOVAN ) 320 mg Oral Tablet; Take 1 Tablet (320 mg total) by mouth Daily for 90 days    potassium chloride  (K-DUR) 10 mEq Oral Tab Sust.Rel. Particle/Crystal; Take 1 Tablet (10 mEq total) by mouth Daily for 90 days    COMPREHENSIVE METABOLIC PNL,  FASTING; Future    Acoustic neuroma (CMS HCC)  Last evaluated in 2023 by ENT. Continue to monitor for any change in symptoms. Routine f/u every 3-5 years per note so would recheck after 2026.        Diarrhea, unspecified type    Orders:    Colestipol  (COLESTID ) 1 gram Oral Tablet; Take 1  Tablet (1 g total) by mouth Twice per day as needed for up to 90 days    MAGNESIUM; Future    Encounter for screening for malignant neoplasm of breast, unspecified screening modality    Orders:    MAMMO BILATERAL SCREENING-ADDL VIEWS/BREAST US  AS REQ BY RAD; Future    Need for hepatitis C screening test    Orders:    HEPATITIS C ANTIBODY SCREEN WITH REFLEX TO HCV PCR; Future    Screening for HIV (human immunodeficiency virus)    Orders:    HIV 1 AND 2 RAPID SCREEN; Future           Counseling with regards to preventative care given. Medication summary was discussed with the patient to verify compliance and understanding. Medication safety was discussed. A good faith effort was made to reconcile the patient's medications.       The patient was given the opportunity to ask questions and those questions were answered to the patient's satisfaction. The patient was encouraged to call with any additional questions or concerns. More than 50% of the visit was spent counseling and coordinating care.    Orders Placed This Encounter    MAMMO BILATERAL SCREENING-ADDL VIEWS/BREAST US  AS REQ BY RAD    COMPREHENSIVE METABOLIC PNL, FASTING    CBC/DIFF    THYROID  STIMULATING HORMONE (SENSITIVE TSH)    IRON  TRANSFERRIN AND TIBC    MAGNESIUM    HEPATITIS C ANTIBODY SCREEN WITH REFLEX TO HCV PCR    HIV 1 AND 2 RAPID SCREEN    CBC WITH DIFF    Colestipol  (COLESTID ) 1 gram Oral Tablet    escitalopram  oxalate (LEXAPRO ) 10 mg Oral Tablet    FEROSUL 325 mg (65 mg iron ) Oral Tablet    furosemide  (LASIX ) 20 mg Oral Tablet    levothyroxine  (SYNTHROID) 25 mcg Oral Tablet    omeprazole  (PRILOSEC) 20 mg Oral Capsule, Delayed Release(E.C.)    valsartan  (DIOVAN ) 320 mg Oral Tablet    potassium chloride  (K-DUR) 10 mEq Oral Tab Sust.Rel. Particle/Crystal                  Return in about 3 months (around 05/06/2024).  Delana Favors, PA-C 02/04/2024, 15:27         [1]   Allergies  Allergen Reactions    Augmentin [Amoxicillin-Pot Clavulanate]      Clindamycin     Quinolones      Other reaction(s): Chest Pain

## 2024-02-04 NOTE — Assessment & Plan Note (Addendum)
 Orders:    levothyroxine  (SYNTHROID) 25 mcg Oral Tablet; Take 1 Tablet (25 mcg total) by mouth Daily for 90 days    THYROID  STIMULATING HORMONE (SENSITIVE TSH); Future
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
## 2024-02-04 NOTE — Assessment & Plan Note (Addendum)
 Last evaluated in 2023 by ENT. Continue to monitor for any change in symptoms. Routine f/u every 3-5 years per note so would recheck after 2026.

## 2024-02-04 NOTE — Assessment & Plan Note (Addendum)
 Orders:    omeprazole (PRILOSEC) 20 mg Oral Capsule, Delayed Release(E.C.); Take 1 Capsule (20 mg total) by mouth Daily for 90 days

## 2024-02-05 ENCOUNTER — Ambulatory Visit (INDEPENDENT_AMBULATORY_CARE_PROVIDER_SITE_OTHER): Payer: Self-pay | Admitting: PHYSICIAN ASSISTANT

## 2024-02-05 DIAGNOSIS — D509 Iron deficiency anemia, unspecified: Secondary | ICD-10-CM

## 2024-02-05 MED ORDER — POTASSIUM CHLORIDE ER 10 MEQ TABLET,EXTENDED RELEASE(PART/CRYST)
10.0000 meq | ORAL_TABLET | Freq: Every day | ORAL | 0 refills | Status: DC
Start: 2024-02-05 — End: 2024-05-07

## 2024-02-05 MED ORDER — FEROSUL 325 MG (65 MG IRON) TABLET
325.0000 mg | ORAL_TABLET | Freq: Two times a day (BID) | ORAL | 0 refills | Status: DC
Start: 2024-02-05 — End: 2024-05-07

## 2024-02-06 LAB — HEPATITIS C ANTIBODY SCREEN WITH REFLEX TO HCV PCR: HCV ANTIBODY QUALITATIVE: NEGATIVE

## 2024-02-13 ENCOUNTER — Ambulatory Visit (HOSPITAL_COMMUNITY): Payer: Self-pay

## 2024-03-03 ENCOUNTER — Ambulatory Visit
Admission: RE | Admit: 2024-03-03 | Discharge: 2024-03-03 | Disposition: A | Discharge status: Home / Self Care | Payer: Self-pay | Source: Ambulatory Visit | Attending: PHYSICIAN ASSISTANT | Admitting: PHYSICIAN ASSISTANT

## 2024-03-03 ENCOUNTER — Encounter (HOSPITAL_COMMUNITY): Payer: Self-pay

## 2024-03-03 ENCOUNTER — Other Ambulatory Visit: Payer: Self-pay

## 2024-03-03 DIAGNOSIS — Z1231 Encounter for screening mammogram for malignant neoplasm of breast: Secondary | ICD-10-CM | POA: Insufficient documentation

## 2024-03-03 DIAGNOSIS — Z1239 Encounter for other screening for malignant neoplasm of breast: Secondary | ICD-10-CM

## 2024-03-04 ENCOUNTER — Encounter (INDEPENDENT_AMBULATORY_CARE_PROVIDER_SITE_OTHER): Payer: Self-pay | Admitting: PHYSICIAN ASSISTANT

## 2024-05-07 ENCOUNTER — Other Ambulatory Visit: Payer: Self-pay

## 2024-05-07 ENCOUNTER — Encounter (INDEPENDENT_AMBULATORY_CARE_PROVIDER_SITE_OTHER): Payer: Self-pay | Admitting: PHYSICIAN ASSISTANT

## 2024-05-07 ENCOUNTER — Ambulatory Visit: Payer: Self-pay | Attending: PHYSICIAN ASSISTANT | Admitting: PHYSICIAN ASSISTANT

## 2024-05-07 VITALS — BP 155/108 | HR 75 | Temp 98.0°F | Ht 65.0 in | Wt 279.0 lb

## 2024-05-07 DIAGNOSIS — D509 Iron deficiency anemia, unspecified: Secondary | ICD-10-CM | POA: Insufficient documentation

## 2024-05-07 DIAGNOSIS — R197 Diarrhea, unspecified: Secondary | ICD-10-CM | POA: Insufficient documentation

## 2024-05-07 DIAGNOSIS — I10 Essential (primary) hypertension: Secondary | ICD-10-CM | POA: Insufficient documentation

## 2024-05-07 DIAGNOSIS — R0982 Postnasal drip: Secondary | ICD-10-CM | POA: Insufficient documentation

## 2024-05-07 DIAGNOSIS — R600 Localized edema: Secondary | ICD-10-CM | POA: Insufficient documentation

## 2024-05-07 DIAGNOSIS — Z124 Encounter for screening for malignant neoplasm of cervix: Secondary | ICD-10-CM | POA: Insufficient documentation

## 2024-05-07 DIAGNOSIS — E039 Hypothyroidism, unspecified: Secondary | ICD-10-CM | POA: Insufficient documentation

## 2024-05-07 DIAGNOSIS — K219 Gastro-esophageal reflux disease without esophagitis: Secondary | ICD-10-CM | POA: Insufficient documentation

## 2024-05-07 DIAGNOSIS — F419 Anxiety disorder, unspecified: Secondary | ICD-10-CM | POA: Insufficient documentation

## 2024-05-07 MED ORDER — FUROSEMIDE 20 MG TABLET: 20.0000 mg | ORAL_TABLET | Freq: Every day | ORAL | 0 refills | Status: DC

## 2024-05-07 MED ORDER — LEVOCETIRIZINE 5 MG TABLET
5.0000 mg | ORAL_TABLET | Freq: Every evening | ORAL | 0 refills | Status: AC
Start: 2024-05-07 — End: 2024-06-06

## 2024-05-07 MED ORDER — FEROSUL 325 MG (65 MG IRON) TABLET: 325.0000 mg | ORAL_TABLET | Freq: Two times a day (BID) | ORAL | 0 refills | Status: DC

## 2024-05-07 MED ORDER — POTASSIUM CHLORIDE ER 10 MEQ TABLET,EXTENDED RELEASE(PART/CRYST)
10.0000 meq | ORAL_TABLET | Freq: Every day | ORAL | 0 refills | Status: DC
Start: 2024-05-07 — End: 2024-05-17

## 2024-05-07 MED ORDER — ESCITALOPRAM 10 MG TABLET: 10.0000 mg | ORAL_TABLET | Freq: Every day | ORAL | 0 refills | Status: DC

## 2024-05-07 MED ORDER — VALSARTAN 320 MG TABLET: 320.0000 mg | ORAL_TABLET | Freq: Every day | ORAL | 0 refills | Status: DC

## 2024-05-07 MED ORDER — OMEPRAZOLE 20 MG CAPSULE,DELAYED RELEASE: 20.0000 mg | DELAYED_RELEASE_CAPSULE | Freq: Every day | ORAL | 0 refills | Status: DC

## 2024-05-07 MED ORDER — IPRATROPIUM BROMIDE 21 MCG (0.03 %) NASAL SPRAY: 2.0000 | Freq: Two times a day (BID) | NASAL | 0 refills | Status: DC

## 2024-05-07 MED ORDER — LEVOTHYROXINE 25 MCG TABLET: 25.0000 ug | ORAL_TABLET | Freq: Every day | ORAL | 0 refills | Status: DC

## 2024-05-07 MED ORDER — COLESTIPOL 1 GRAM TABLET: 1.0000 g | ORAL_TABLET | Freq: Two times a day (BID) | ORAL | 0 refills | Status: DC | PRN
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
## 2024-05-07 NOTE — Progress Notes (Signed)
 PRN MEDICAL OFFICE [facility]  FAMILY MEDICINE, MEDICAL OFFICE BUILDING  [address]  [place] [address]  [phone]   Makisha Marrin  Nov 04, 1965  [id number]    Date of Service: 05/07/2024  Chief complaint:   Chief Complaint   Patient presents with    Follow Up     Subjective:   Sandra Carlson is a 58 y.o. female and presents today for follow-up on chronic disease management and medication refills.   Colonoscopy with Dr. Tobie approx 8 years ago.   Pap - unsure of last approx 4-7 years ago.   HTN - on valsartan  320mg . Not checking at home. Denies chest pain, SOB, heart flutters, dizziness, headaches.   Anxiety - on Lexapro , helpful. Does help care for her dad which keeps her busy.   She reports having a lot sinus drainage/mucus in throat at night. Ongoing for a while but getting worse. She has tried Claritin, acid reflux medicine, mucinex, and nasal spray without relief. Morning stuffiness. She has had increased allergies as she has gotten older especially with her pets. She has used humidifier and head propped up without relief. She does also snore.     Past Medical History:   Diagnosis Date    Acoustic neuroma (CMS HCC)     Allergic rhinitis     Cancer (CMS HCC)     SKIN    Essential hypertension     Hearing loss     Sleep apnea     Thyroid  disease          Past Surgical History:   Procedure Laterality Date    ANKLE SURGERY      HX OTHER      HX TONSILLECTOMY      HX TUBAL LIGATION      LAPAROSCOPIC CHOLECYSTECTOMY      SHOULDER SURGERY Right          Current Outpatient Medications   Medication Sig Dispense Refill    Colestipol  (COLESTID ) 1 gram Oral Tablet Take 1 Tablet (1 g total) by mouth Twice per day as needed for up to 90 days 180 Tablet 0    escitalopram  oxalate (LEXAPRO ) 10 mg Oral Tablet Take 1 Tablet (10 mg total) by mouth Daily 90 Tablet 0    FEROSUL 325 mg (65 mg iron ) Oral Tablet Take 1 Tablet (325 mg total) by mouth Twice daily with food Indications: anemia from inadequate iron  180 Tablet  0    furosemide  (LASIX ) 20 mg Oral Tablet Take 1 Tablet (20 mg total) by mouth Daily for 90 days 90 Tablet 0    ipratropium bromide  (ATROVENT ) 21 mcg (0.03 %) Nasal nasal spray Administer 2 Sprays into affected nostril(s) Every 12 hours 30 mL 0    Levocetirizine (XYZAL) 5 mg Oral Tablet Take 1 Tablet (5 mg total) by mouth Every evening for 30 days 30 Tablet 0    levothyroxine  (SYNTHROID) 25 mcg Oral Tablet Take 1 Tablet (25 mcg total) by mouth Daily 90 Tablet 0    omeprazole  (PRILOSEC) 20 mg Oral Capsule, Delayed Release(E.C.) Take 1 Capsule (20 mg total) by mouth Daily for 90 days 90 Capsule 0    potassium chloride  (K-DUR) 10 mEq Oral Tab Sust.Rel. Particle/Crystal Take 1 Tablet (10 mEq total) by mouth Daily Indications: low amount of potassium in the blood 7 Tablet 0    valsartan  (DIOVAN ) 320 mg Oral Tablet Take 1 Tablet (320 mg total) by mouth Daily 90 Tablet 0     No current facility-administered  medications for this visit.     Allergies[1]  Review of Systems:  Any pertinent Review of Systems as addressed in the HPI above.    Objective:   BP (!) 155/108   Pulse 75   Temp 36.7 C (98 F) (Temporal)   Ht 1.651 m (5' 5)   Wt 127 kg (279 lb)   SpO2 96%   BMI 46.43 kg/m       BMI addressed: Advised on diet, weight loss, and exercise to reduce above normal BMI.       Constitutional: No acute distress. Alert and oriented.  Head: Normocephalic, atraumatic.  Eyes: Clear. PERRL. EOMI.  Ears: Canals and TMs clear and intact bilaterally.  Nose: Patent. No rhinorrhea.  Throat: PND  Neck: Supple. No lymphadenopathy. No bruit.   Lungs: Clear to auscultation bilaterally. No wheezes, rhonchi, or rales.  Heart: Regular rate and rhythm. No murmurs.  Abdomen: Soft, non-tender, nondistended. BS active.  Extremities: Intact x 4. No edema. Gait stable.  Neuro: CN 2-12 grossly intact.   Psych: Mood and affect appropriate and congruent.    Laboratory:  COMPLETE BLOOD COUNT   Lab Results   Component Value Date    WBC 6.8  02/04/2024    HGB 15.8 (H) 02/04/2024    HCT 46.3 (H) 02/04/2024    PLTCNT 236 02/04/2024       DIFFERENTIAL  Lab Results   Component Value Date    PMNS 59 02/04/2024    LYMPHOCYTES 27 02/04/2024    MONOCYTES 10 02/04/2024    EOSINOPHIL 3 02/04/2024    BASOPHILS 1 02/04/2024    BASOPHILS 0.10 02/04/2024    PMNABS 4.00 02/04/2024    LYMPHSABS 1.80 02/04/2024    EOSABS 0.20 02/04/2024    MONOSABS 0.70 02/04/2024     BASIC METABOLIC PANEL  Lab Results   Component Value Date    SODIUM 139 02/04/2024    POTASSIUM 3.3 (L) 02/04/2024    CHLORIDE 108 (H) 02/04/2024    CO2 23 02/04/2024    ANIONGAP 8 02/04/2024    BUN 16 02/04/2024    CREATININE 1.05 02/04/2024    BUNCRRATIO 15 02/04/2024    GFR 62 02/04/2024    CALCIUM 9.0 02/04/2024    GLUCOSENF 107 02/04/2024      THYROID  STIMULATING HORMONE  Lab Results   Component Value Date    TSH 4.834 02/04/2024        Most recent lab results reviewed with patient.    Assessment & Plan  Anxiety  Beneficial. Continue current  Orders:    escitalopram  oxalate (LEXAPRO ) 10 mg Oral Tablet; Take 1 Tablet (10 mg total) by mouth Daily    Hypothyroidism, unspecified type  Check TSH adjust synthroid if needed.   Orders:    THYROID  STIMULATING HORMONE (SENSITIVE TSH); Future    levothyroxine  (SYNTHROID) 25 mcg Oral Tablet; Take 1 Tablet (25 mcg total) by mouth Daily    Edema, peripheral    Orders:    furosemide  (LASIX ) 20 mg Oral Tablet; Take 1 Tablet (20 mg total) by mouth Daily for 90 days    potassium chloride  (K-DUR) 10 mEq Oral Tab Sust.Rel. Particle/Crystal; Take 1 Tablet (10 mEq total) by mouth Daily Indications: low amount of potassium in the blood    Gastroesophageal reflux disease, unspecified whether esophagitis present    Orders:    omeprazole  (PRILOSEC) 20 mg Oral Capsule, Delayed Release(E.C.); Take 1 Capsule (20 mg total) by mouth Daily for 90 days    Hypertension,  unspecified type  Check BP at home and call if consistently elevated >140/90. Consider adding an additional  medication.   Orders:    COMPREHENSIVE METABOLIC PNL, FASTING; Future    valsartan  (DIOVAN ) 320 mg Oral Tablet; Take 1 Tablet (320 mg total) by mouth Daily    Iron  deficiency anemia  Check cbc and iron  panel.   Orders:    CBC/DIFF; Future    IRON  TRANSFERRIN AND TIBC; Future    FEROSUL 325 mg (65 mg iron ) Oral Tablet; Take 1 Tablet (325 mg total) by mouth Twice daily with food Indications: anemia from inadequate iron     Diarrhea, unspecified type    Orders:    Colestipol  (COLESTID ) 1 gram Oral Tablet; Take 1 Tablet (1 g total) by mouth Twice per day as needed for up to 90 days    Post-nasal drip  If not helpful consider f/u with ENT.   Orders:    ipratropium bromide  (ATROVENT ) 21 mcg (0.03 %) Nasal nasal spray; Administer 2 Sprays into affected nostril(s) Every 12 hours    Levocetirizine (XYZAL) 5 mg Oral Tablet; Take 1 Tablet (5 mg total) by mouth Every evening for 30 days    Encounter for screening for cervical cancer  Unsure of last pap. Hx of abnormal pap, recommend gyn eval for pap.   Orders:    Referral to External Provider           Counseling with regards to preventative care given. Medication summary was discussed with the patient to verify compliance and understanding. Medication safety was discussed. A good faith effort was made to reconcile the patient's medications.     The patient was given the opportunity to ask questions and those questions were answered to the patient's satisfaction. The patient was encouraged to call with any additional questions or concerns. More than 50% of the visit was spent counseling and coordinating care.    Orders Placed This Encounter    THYROID  STIMULATING HORMONE (SENSITIVE TSH)    CBC/DIFF    COMPREHENSIVE METABOLIC PNL, FASTING    IRON  TRANSFERRIN AND TIBC    Referral to External Provider    escitalopram  oxalate (LEXAPRO ) 10 mg Oral Tablet    FEROSUL 325 mg (65 mg iron ) Oral Tablet    furosemide  (LASIX ) 20 mg Oral Tablet    levothyroxine  (SYNTHROID) 25 mcg Oral Tablet     omeprazole  (PRILOSEC) 20 mg Oral Capsule, Delayed Release(E.C.)    potassium chloride  (K-DUR) 10 mEq Oral Tab Sust.Rel. Particle/Crystal    valsartan  (DIOVAN ) 320 mg Oral Tablet    Colestipol  (COLESTID ) 1 gram Oral Tablet    ipratropium bromide  (ATROVENT ) 21 mcg (0.03 %) Nasal nasal spray    Levocetirizine (XYZAL) 5 mg Oral Tablet                  Return in about 3 months (around 08/06/2024).  [address] [place], PA-C 05/07/2024, 09:01         [1]   Allergies  Allergen Reactions    Augmentin [Amoxicillin-Pot Clavulanate]     Clindamycin     Quinolones      Other reaction(s): Chest Pain

## 2024-05-07 NOTE — Assessment & Plan Note (Addendum)
 Check TSH adjust synthroid if needed.   Orders:    THYROID  STIMULATING HORMONE (SENSITIVE TSH); Future    levothyroxine  (SYNTHROID) 25 mcg Oral Tablet; Take 1 Tablet (25 mcg total) by mouth Daily

## 2024-05-07 NOTE — Assessment & Plan Note (Addendum)
 Orders:    omeprazole (PRILOSEC) 20 mg Oral Capsule, Delayed Release(E.C.); Take 1 Capsule (20 mg total) by mouth Daily for 90 days

## 2024-05-11 ENCOUNTER — Other Ambulatory Visit (INDEPENDENT_AMBULATORY_CARE_PROVIDER_SITE_OTHER): Payer: Self-pay

## 2024-05-13 ENCOUNTER — Ambulatory Visit (INDEPENDENT_AMBULATORY_CARE_PROVIDER_SITE_OTHER): Payer: Self-pay | Admitting: PHYSICIAN ASSISTANT

## 2024-05-13 ENCOUNTER — Ambulatory Visit: Attending: PHYSICIAN ASSISTANT

## 2024-05-13 ENCOUNTER — Other Ambulatory Visit: Payer: Self-pay

## 2024-05-13 DIAGNOSIS — E039 Hypothyroidism, unspecified: Secondary | ICD-10-CM

## 2024-05-13 DIAGNOSIS — I10 Essential (primary) hypertension: Secondary | ICD-10-CM

## 2024-05-13 DIAGNOSIS — D509 Iron deficiency anemia, unspecified: Secondary | ICD-10-CM

## 2024-05-13 LAB — IRON TRANSFERRIN AND TIBC
IRON (TRANSFERRIN) SATURATION: 32 % (ref 15–50)
IRON: 91 ug/dL (ref 50–212)
TOTAL IRON BINDING CAPACITY: 288 ug/dL (ref 250–450)
TRANSFERRIN: 206 mg/dL (ref 203–362)
UIBC: 197 ug/dL (ref 130–375)

## 2024-05-13 LAB — CBC WITH DIFF
BASOPHIL #: 0.1 x10ˆ3/uL (ref 0.00–0.10)
BASOPHIL %: 1 % (ref 0–1)
EOSINOPHIL #: 0.3 x10ˆ3/uL (ref 0.00–0.50)
EOSINOPHIL %: 5 % (ref 1–7)
HCT: 45.6 % — ABNORMAL HIGH (ref 31.2–41.9)
HGB: 15.8 g/dL — ABNORMAL HIGH (ref 10.9–14.3)
LYMPHOCYTE #: 1.9 x10ˆ3/uL (ref 1.10–3.10)
LYMPHOCYTE %: 28 % (ref 16–46)
MCH: 28.9 pg (ref 24.7–32.8)
MCHC: 34.6 g/dL (ref 32.3–35.6)
MCV: 83.5 fL (ref 75.5–95.3)
MONOCYTE #: 0.5 x10ˆ3/uL (ref 0.20–0.90)
MONOCYTE %: 7 % (ref 4–11)
MPV: 8.3 fL (ref 7.9–10.8)
NEUTROPHIL #: 4 x10ˆ3/uL (ref 1.90–8.20)
NEUTROPHIL %: 58 % (ref 43–77)
PLATELETS: 237 x10ˆ3/uL (ref 140–440)
RBC: 5.46 x10ˆ6/uL — ABNORMAL HIGH (ref 3.63–4.92)
RDW: 14.2 % (ref 12.3–17.7)
WBC: 6.9 x10ˆ3/uL (ref 3.8–11.8)

## 2024-05-13 LAB — COMPREHENSIVE METABOLIC PNL, FASTING
ALBUMIN/GLOBULIN RATIO: 1.5 — ABNORMAL HIGH (ref 0.8–1.4)
ALBUMIN: 4.2 g/dL (ref 3.5–5.7)
ALKALINE PHOSPHATASE: 92 U/L (ref 34–104)
ALT (SGPT): 27 U/L (ref 7–52)
ANION GAP: 8 mmol/L (ref 4–13)
AST (SGOT): 24 U/L (ref 13–39)
BILIRUBIN TOTAL: 0.5 mg/dL (ref 0.3–1.0)
BUN/CREA RATIO: 18 (ref 6–22)
BUN: 17 mg/dL (ref 7–25)
CALCIUM, CORRECTED: 8.9 mg/dL (ref 8.9–10.8)
CALCIUM: 9.1 mg/dL (ref 8.6–10.3)
CHLORIDE: 106 mmol/L (ref 98–107)
CO2 TOTAL: 27 mmol/L (ref 21–31)
CREATININE: 0.96 mg/dL (ref 0.60–1.30)
ESTIMATED GFR: 69 mL/min/1.73mˆ2 (ref >59)
GLOBULIN: 2.8 (ref 2.0–3.5)
GLUCOSE: 104 mg/dL (ref 74–109)
OSMOLALITY, CALCULATED: 283 mosm/kg (ref 270–290)
POTASSIUM: 4.3 mmol/L (ref 3.5–5.1)
PROTEIN TOTAL: 7 g/dL (ref 6.4–8.9)
SODIUM: 141 mmol/L (ref 136–145)

## 2024-05-13 LAB — THYROID STIMULATING HORMONE (SENSITIVE TSH): TSH: 4.163 u[IU]/mL (ref 0.450–5.330)

## 2024-05-17 ENCOUNTER — Other Ambulatory Visit (INDEPENDENT_AMBULATORY_CARE_PROVIDER_SITE_OTHER): Payer: Self-pay | Admitting: PHYSICIAN ASSISTANT

## 2024-05-17 MED ORDER — POTASSIUM CHLORIDE ER 10 MEQ TABLET,EXTENDED RELEASE(PART/CRYST): 10.0000 meq | ORAL_TABLET | Freq: Every day | ORAL | 0 refills | Status: DC

## 2024-05-18 ENCOUNTER — Other Ambulatory Visit: Payer: Self-pay

## 2024-05-18 ENCOUNTER — Emergency Department
Admission: EM | Admit: 2024-05-18 | Discharge: 2024-05-18 | Disposition: A | Discharge status: Home / Self Care | Attending: Emergency Medicine | Admitting: Emergency Medicine

## 2024-05-18 ENCOUNTER — Encounter (HOSPITAL_COMMUNITY): Payer: Self-pay | Admitting: Emergency Medicine

## 2024-05-18 ENCOUNTER — Emergency Department (HOSPITAL_COMMUNITY)

## 2024-05-18 DIAGNOSIS — X501XXA Overexertion from prolonged static or awkward postures, initial encounter: Secondary | ICD-10-CM | POA: Insufficient documentation

## 2024-05-18 DIAGNOSIS — M79672 Pain in left foot: Secondary | ICD-10-CM | POA: Insufficient documentation

## 2024-05-18 DIAGNOSIS — Y9389 Activity, other specified: Secondary | ICD-10-CM | POA: Insufficient documentation

## 2024-05-18 DIAGNOSIS — M79673 Pain in unspecified foot: Secondary | ICD-10-CM

## 2024-05-18 MED ORDER — NAPROXEN 500 MG TABLET
500.0000 mg | ORAL_TABLET | Freq: Two times a day (BID) | ORAL | 0 refills | Status: AC | PRN
Start: 2024-05-18 — End: ?
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
## 2024-05-18 NOTE — Discharge Instructions (Signed)

## 2024-05-18 NOTE — ED Provider Notes (Signed)
 [facility]  ED Primary Provider Note  Patient Name: Sandra Carlson  Patient Age: 58 y.o.  Date of Birth: 1966/03/23    Chief Complaint: Foot Injury        History of Present Illness       Sandra Carlson is a 58 y.o. female who had concerns including Foot Injury.     History of Present Illness  Sandra Carlson is a 58 year old female who presents with foot pain after an injury.    She experienced foot pain after landing incorrectly while crossing a ditch line on her road yesterday. At the time of the incident, she was picking up trash. The pain is localized to the top of her foot, with noticeable swelling and a 'popped out' appearance on the side. No pain in the ankle or toes.    She confirms the ability to feel her toes. No other injuries, including neck, back, or abdominal pain.  No neurovascular deficit reported.      Review of Systems     No other overt Review of Systems are noted to be positive except noted in the HPI.      Historical Data   History Reviewed This Encounter: Medical History  Surgical History  Family History  Social History      Physical Exam   ED Triage Vitals [05/18/24 0902]   BP (Non-Invasive) (!) 173/101   Heart Rate 77   Respiratory Rate 20   Temperature (!) 35.7 C (96.3 F)   SpO2 97 %   Weight 125 kg (275 lb)   Height 1.651 m (5' 5)         Nursing notes reviewed for what could be assessed. Past Medical, Surgical, and Social history reviewed for what has been completed.     Constitutional: NAD. Well-Developed. Well Nourished.  Head: Normocephalic, atraumatic.  Mouth/Throat:  Symmetric facial movement.  Eyes: EOM grossly intact, conjunctiva normal.  Neck: Supple, no neck tenderness  Cardiovascular: Regular Rate and Rhythm, extremities well perfused.  Pulmonary/Chest: No respiratory distress. Lungs are symmetric to auscultation bilaterally.  Abdominal: Soft, non-tender, non-distended. Non peritoneal, no rebound, no guarding.  MSK: No Lower Extremity Edema.  Skin:  Warm, dry.  Slight ecchymosis noted of the dorsal aspect of the left foot.  Neuro: Appropriate, CN II-XII grossly intact.  Psych: Pleasant        Physical Exam  EXTREMITIES: Good perfusion in foot.      Procedures      Patient Data   Labs Ordered/Reviewed - No data to display    XR FOOT LEFT   Final Result by Edi, Radresults In (09/23 0952)   No fracture of the left foot is identified. If clinical concern remains, a follow-up study in 7-10 days would be helpful.         Radiologist location ID: [id number]             Medical Decision Making          Medical Decision Making  Amount and/or Complexity of Data Reviewed  Radiology: ordered.    Risk  Prescription drug management.        Results            MDM Narrative:  Medical Decision Making  A 58 year old female presented with acute right foot pain and swelling after misstepping while picking up trash the previous day. She reported localized pain and swelling over the dorsum of the foot with a visible protrusion on the  side, but denied injury to the ankle, toes, neck, back, or abdomen. Examination revealed good perfusion and intact sensation in the toes. No significant past medical history, allergies, or prior surgeries were reported.    Acute right foot pain and swelling, possible injury  - Order x-ray of the right foot to assess for fracture or other injury        Follow-Up Discussion:  Patient updated.  Agreeable to plan.  I discussed rice treatment, and various splint/wrapped techniques.    Please see documentation above for specific labs and radiology.      Consent for AI Scribe software Abridge discussed/obtained verbally for encounter                                   Following the history, physical exam, and ED workup, the patient was deemed stable and suitable for discharge. The patient/caregiver was advised to return to the ED for any new or worsening symptoms. Discharge medications, and follow-up instructions were discussed with the patient/caregiver in  detail, who verbalizes understanding. The patient/caregiver is in agreement and is comfortable with the plan of care.    Disposition: Discharged         Current Discharge Medication List        START taking these medications.        Details   naproxen  500 mg Tablet  Commonly known as: NAPROSYN    500 mg, Oral, EVERY 12 HOURS PRN  Qty: 10 Tablet  Refills: 0            CONTINUE these medications - NO CHANGES were made during your visit.        Details   Colestipol  1 gram Tablet  Commonly known as: COLESTID    1 g, Oral, 2 TIMES DAILY PRN  Qty: 180 Tablet  Refills: 0     escitalopram  oxalate 10 mg Tablet  Commonly known as: LEXAPRO    10 mg, Oral, Daily  Qty: 90 Tablet  Refills: 0     FeroSuL 325 mg (65 mg iron ) Tablet  Generic drug: ferrous sulfate   325 mg, Oral, 2 TIMES DAILY WITH FOOD  Qty: 180 Tablet  Refills: 0     furosemide  20 mg Tablet  Commonly known as: LASIX    20 mg, Oral, Daily  Qty: 90 Tablet  Refills: 0     ipratropium bromide  21 mcg (0.03 %) nasal spray  Commonly known as: ATROVENT    2 Sprays, Nasal, EVERY 12 HOURS  Qty: 30 mL  Refills: 0     Levocetirizine 5 mg Tablet  Commonly known as: XYZAL   5 mg, Oral, EVERY EVENING  Qty: 30 Tablet  Refills: 0     levothyroxine  25 mcg Tablet  Commonly known as: Synthroid   25 mcg, Oral, Daily  Qty: 90 Tablet  Refills: 0     omeprazole  20 mg Capsule, Delayed Release(E.C.)  Commonly known as: PRILOSEC   20 mg, Oral, Daily  Qty: 90 Capsule  Refills: 0     potassium chloride  10 mEq Tab Sust.Rel. Particle/Crystal  Commonly known as: K-DUR   10 mEq, Oral, Daily  Qty: 90 Tablet  Refills: 0     valsartan  320 mg Tablet  Commonly known as: DIOVAN    320 mg, Oral, Daily  Qty: 90 Tablet  Refills: 0            Follow up:   No follow-up provider specified.  Risk as noted in the medical decision-making is in reference to potential morbidity/mortality of management based upon previously established billing guidelines.    Based upon the clinical setting, the likely  diagnosis/impression include:    Clinical Impression   Foot pain (Primary)         Discharge Medication List as of 05/18/2024  9:54 AM        START taking these medications    Details   naproxen  (NAPROSYN ) 500 mg Oral Tablet Take 1 Tablet (500 mg total) by mouth Every 12 hours as needed for Pain, Disp-10 Tablet, R-0, E-Rx                   This note was partially created using voice recognition software and is inherently subject to errors including those of syntax and sound alike  substitutions which may escape proof reading. In such instances, original meaning may be extrapolated by contextual derivation.      /R. Elspeth Griffin, MD, LYDIA PILLING  Department of Emergency Medicine  [facility]

## 2024-05-20 ENCOUNTER — Telehealth (INDEPENDENT_AMBULATORY_CARE_PROVIDER_SITE_OTHER): Payer: Self-pay | Admitting: PHYSICIAN ASSISTANT

## 2024-05-20 MED ORDER — METOPROLOL SUCCINATE ER 25 MG TABLET,EXTENDED RELEASE 24 HR
25.0000 mg | ORAL_TABLET | Freq: Every day | ORAL | 0 refills | Status: DC
Start: 2024-05-20 — End: 2024-06-17

## 2024-05-20 NOTE — Nursing Note (Signed)
 Patient called and stated that her BP is not going down BP today 148/102,

## 2024-05-25 ENCOUNTER — Ambulatory Visit: Attending: PHYSICIAN ASSISTANT

## 2024-05-25 ENCOUNTER — Other Ambulatory Visit: Payer: Self-pay

## 2024-05-25 DIAGNOSIS — Z23 Encounter for immunization: Secondary | ICD-10-CM | POA: Insufficient documentation

## 2024-06-17 ENCOUNTER — Other Ambulatory Visit (INDEPENDENT_AMBULATORY_CARE_PROVIDER_SITE_OTHER): Payer: Self-pay | Admitting: PHYSICIAN ASSISTANT

## 2024-06-17 MED ORDER — METOPROLOL SUCCINATE ER 25 MG TABLET,EXTENDED RELEASE 24 HR: 25.0000 mg | ORAL_TABLET | Freq: Every day | ORAL | 0 refills | Status: DC

## 2024-08-10 ENCOUNTER — Ambulatory Visit: Payer: Self-pay | Attending: PHYSICIAN ASSISTANT | Admitting: PHYSICIAN ASSISTANT

## 2024-08-10 ENCOUNTER — Other Ambulatory Visit: Payer: Self-pay

## 2024-08-10 ENCOUNTER — Encounter (INDEPENDENT_AMBULATORY_CARE_PROVIDER_SITE_OTHER): Payer: Self-pay | Admitting: PHYSICIAN ASSISTANT

## 2024-08-10 ENCOUNTER — Ambulatory Visit (INDEPENDENT_AMBULATORY_CARE_PROVIDER_SITE_OTHER): Payer: Self-pay | Admitting: PHYSICIAN ASSISTANT

## 2024-08-10 DIAGNOSIS — K219 Gastro-esophageal reflux disease without esophagitis: Secondary | ICD-10-CM | POA: Insufficient documentation

## 2024-08-10 DIAGNOSIS — I10 Essential (primary) hypertension: Secondary | ICD-10-CM | POA: Insufficient documentation

## 2024-08-10 DIAGNOSIS — D509 Iron deficiency anemia, unspecified: Secondary | ICD-10-CM | POA: Insufficient documentation

## 2024-08-10 DIAGNOSIS — R600 Localized edema: Secondary | ICD-10-CM | POA: Insufficient documentation

## 2024-08-10 DIAGNOSIS — E039 Hypothyroidism, unspecified: Secondary | ICD-10-CM | POA: Insufficient documentation

## 2024-08-10 DIAGNOSIS — R197 Diarrhea, unspecified: Secondary | ICD-10-CM

## 2024-08-10 DIAGNOSIS — F419 Anxiety disorder, unspecified: Secondary | ICD-10-CM

## 2024-08-10 LAB — COMPREHENSIVE METABOLIC PNL, FASTING
ALBUMIN/GLOBULIN RATIO: 1.5 — ABNORMAL HIGH (ref 0.8–1.4)
ALBUMIN: 4.2 g/dL (ref 3.5–5.7)
ALKALINE PHOSPHATASE: 88 U/L (ref 34–104)
ALT (SGPT): 22 U/L (ref 7–52)
ANION GAP: 5 mmol/L (ref 4–13)
AST (SGOT): 22 U/L (ref 13–39)
BILIRUBIN TOTAL: 0.5 mg/dL (ref 0.3–1.0)
BUN/CREA RATIO: 22 (ref 6–22)
BUN: 20 mg/dL (ref 7–25)
CALCIUM, CORRECTED: 9.1 mg/dL (ref 8.9–10.8)
CALCIUM: 9.3 mg/dL (ref 8.6–10.3)
CHLORIDE: 107 mmol/L (ref 98–107)
CO2 TOTAL: 29 mmol/L (ref 21–31)
CREATININE: 0.92 mg/dL (ref 0.60–1.30)
ESTIMATED GFR: 72 mL/min/1.73mˆ2 (ref >59)
GLOBULIN: 2.8 (ref 2.0–3.5)
GLUCOSE: 90 mg/dL (ref 74–109)
OSMOLALITY, CALCULATED: 284 mosm/kg (ref 270–290)
POTASSIUM: 4.4 mmol/L (ref 3.5–5.1)
PROTEIN TOTAL: 7 g/dL (ref 6.4–8.9)
SODIUM: 141 mmol/L (ref 136–145)

## 2024-08-10 LAB — CBC WITH DIFF
BASOPHIL #: 0.1 x10ˆ3/uL (ref 0.00–0.10)
BASOPHIL %: 1 % (ref 0–1)
EOSINOPHIL #: 0.3 x10ˆ3/uL (ref 0.00–0.50)
EOSINOPHIL %: 4 % (ref 1–7)
HCT: 45.2 % — ABNORMAL HIGH (ref 31.2–41.9)
HGB: 15.4 g/dL — ABNORMAL HIGH (ref 10.9–14.3)
LYMPHOCYTE #: 1.7 x10ˆ3/uL (ref 1.10–3.10)
LYMPHOCYTE %: 25 % (ref 16–46)
MCH: 29.1 pg (ref 24.7–32.8)
MCHC: 34.1 g/dL (ref 32.3–35.6)
MCV: 85.3 fL (ref 75.5–95.3)
MONOCYTE #: 0.5 x10ˆ3/uL (ref 0.20–0.90)
MONOCYTE %: 7 % (ref 4–11)
MPV: 9.1 fL (ref 7.9–10.8)
NEUTROPHIL #: 4.4 x10ˆ3/uL (ref 1.90–8.20)
NEUTROPHIL %: 63 % (ref 43–77)
PLATELETS: 226 x10ˆ3/uL (ref 140–440)
RBC: 5.3 x10ˆ6/uL — ABNORMAL HIGH (ref 3.63–4.92)
RDW: 14.2 % (ref 12.3–17.7)
WBC: 6.9 x10ˆ3/uL (ref 3.8–11.8)

## 2024-08-10 LAB — IRON TRANSFERRIN AND TIBC
IRON (TRANSFERRIN) SATURATION: 38 % (ref 15–50)
IRON: 103 ug/dL (ref 50–212)
TOTAL IRON BINDING CAPACITY: 274 ug/dL (ref 250–450)
TRANSFERRIN: 196 mg/dL — ABNORMAL LOW (ref 203–362)
UIBC: 171 ug/dL (ref 130–375)

## 2024-08-10 LAB — THYROID STIMULATING HORMONE (SENSITIVE TSH): TSH: 4.878 u[IU]/mL (ref 0.450–5.330)

## 2024-08-10 MED ORDER — ESCITALOPRAM 10 MG TABLET: 10.0000 mg | ORAL_TABLET | Freq: Every day | ORAL | 0 refills | Status: AC

## 2024-08-10 MED ORDER — FEROSUL 325 MG (65 MG IRON) TABLET: 325.0000 mg | ORAL_TABLET | Freq: Two times a day (BID) | ORAL | 0 refills | Status: AC

## 2024-08-10 MED ORDER — COLESTIPOL 1 GRAM TABLET: 2.0000 g | ORAL_TABLET | Freq: Two times a day (BID) | ORAL | 0 refills | Status: AC | PRN

## 2024-08-10 MED ORDER — OMEPRAZOLE 20 MG CAPSULE,DELAYED RELEASE: 20.0000 mg | DELAYED_RELEASE_CAPSULE | Freq: Every day | ORAL | 0 refills | Status: AC

## 2024-08-10 MED ORDER — VALSARTAN 320 MG TABLET: 320.0000 mg | ORAL_TABLET | Freq: Every day | ORAL | 0 refills | Status: AC

## 2024-08-10 MED ORDER — METOPROLOL SUCCINATE ER 25 MG TABLET,EXTENDED RELEASE 24 HR: 25.0000 mg | ORAL_TABLET | Freq: Every day | ORAL | 0 refills | Status: AC

## 2024-08-10 MED ORDER — LEVOTHYROXINE 25 MCG TABLET: 25.0000 ug | ORAL_TABLET | Freq: Every day | ORAL | 0 refills | Status: AC

## 2024-08-10 MED ORDER — COLESTIPOL 1 GRAM TABLET: 2.0000 g | ORAL_TABLET | Freq: Two times a day (BID) | ORAL | 0 refills | Status: DC | PRN

## 2024-08-10 MED ORDER — FUROSEMIDE 20 MG TABLET: 20.0000 mg | ORAL_TABLET | Freq: Every day | ORAL | 0 refills | Status: AC

## 2024-08-10 MED ORDER — POTASSIUM CHLORIDE ER 10 MEQ TABLET,EXTENDED RELEASE(PART/CRYST): 10.0000 meq | ORAL_TABLET | Freq: Every day | ORAL | 0 refills | Status: AC

## 2024-08-10 NOTE — Nursing Note (Signed)
 08/10/24 0811   Recent Weight Change   Have you had a recent unexplained weight loss or gain? N   Health Education and Literacy   How often do you have a problem understanding what is told to you about your medical condition?  Never   Domestic Violence   Because we are aware of abuse and domestic violence today, we ask all patients: Are you being hurt, hit, or frightened by anyone at your home or in your life?  N   Basic Needs   Do you have any basic needs within your home that are not being met? (such as Food, Shelter, [employer], Tranportation, paying for bills and/or medications) N   Advanced Directives   Do you have any advanced directives? No Advance   Would you like an advanced directive packet? Refused Packet

## 2024-08-10 NOTE — Nursing Note (Signed)
 08/10/24 9187   Fall Risk Assessment   Do you feel unsteady when standing or walking? Yes   Do you worry about falling? No   Have you fallen in the past year? Yes   How many times have you fallen? Once   Were you ever injured from falling? Yes

## 2024-08-10 NOTE — Progress Notes (Addendum)
 [facility]  FAMILY MEDICINE, MEDICAL OFFICE BUILDING  [address]  [place] [address]  [phone]   Sandra Carlson  06/12/1966  [id number]    Date of Service: 08/10/2024  Chief complaint:   Chief Complaint   Patient presents with    Follow Up     Subjective:   Sandra Carlson is a 58 y.o. female and presents today for follow-up on chronic disease management and medication refills.   HTN: on valsartan  and metoprolol . BP running better. Denies chest pain, sob, heart flutters, diizziness.   Dumping sydrome,diarrhea: colestid  helps most of the time but sometimes even with both doses she still has diarrhea. Denies abdominal pain, nausea, vomiting.   Anxiety: on Lexapro , beneficial.   Iron  def anemia: on ferrous sulfate, last iron  level 91.  Denies new concerns otherwise.     Past Medical History:   Diagnosis Date    Acoustic neuroma (CMS HCC)     Allergic rhinitis     Cancer (CMS HCC)     SKIN    Essential hypertension     Hearing loss     Sleep apnea     Thyroid  disease          Past Surgical History:   Procedure Laterality Date    ANKLE SURGERY      HX OTHER      HX TONSILLECTOMY      HX TUBAL LIGATION      LAPAROSCOPIC CHOLECYSTECTOMY      SHOULDER SURGERY Right          Current Outpatient Medications   Medication Sig Dispense Refill    Colestipol  (COLESTID ) 1 gram Oral Tablet Take 2 Tablets (2 g total) by mouth Twice per day as needed for up to 90 days 360 Tablet 0    escitalopram  oxalate (LEXAPRO ) 10 mg Oral Tablet Take 1 Tablet (10 mg total) by mouth Daily 90 Tablet 0    FEROSUL 325 mg (65 mg iron ) Oral Tablet Take 1 Tablet (325 mg total) by mouth Twice daily with food Indications: anemia from inadequate iron  180 Tablet 0    furosemide  (LASIX ) 20 mg Oral Tablet Take 1 Tablet (20 mg total) by mouth Daily for 90 days 90 Tablet 0    levothyroxine  (SYNTHROID) 25 mcg Oral Tablet Take 1 Tablet (25 mcg total) by mouth Daily 90 Tablet 0    metoprolol  succinate (TOPROL -XL) 25 mg Oral Tablet  Sustained Release 24 hr Take 1 Tablet (25 mg total) by mouth Daily 90 Tablet 0    naproxen  (NAPROSYN ) 500 mg Oral Tablet Take 1 Tablet (500 mg total) by mouth Every 12 hours as needed for Pain 10 Tablet 0    omeprazole  (PRILOSEC) 20 mg Oral Capsule, Delayed Release(E.C.) Take 1 Capsule (20 mg total) by mouth Daily for 90 days 90 Capsule 0    potassium chloride  (K-DUR) 10 mEq Oral Tab Sust.Rel. Particle/Crystal Take 1 Tablet (10 mEq total) by mouth Daily Indications: low amount of potassium in the blood 90 Tablet 0    valsartan  (DIOVAN ) 320 mg Oral Tablet Take 1 Tablet (320 mg total) by mouth Daily 90 Tablet 0     No current facility-administered medications for this visit.     Allergies[1]  Review of Systems:  Any pertinent Review of Systems as addressed in the HPI above.    Objective:   BP 122/84 (Site: Left Arm, Patient Position: Sitting, Cuff Size: Adult)   Pulse 74   Temp 36.7 C (98  F) (Tympanic)   Resp 18   Ht 1.664 m (5' 5.5)   Wt 129 kg (284 lb 9.6 oz)   SpO2 96%   BMI 46.64 kg/m       BMI addressed: Advised on diet, weight loss, and exercise to reduce above normal BMI.       Constitutional: No acute distress. Alert and oriented.  Head: Normocephalic, atraumatic.  Eyes: Clear. PERRL. EOMI.  Ears: Canals and TMs clear and intact bilaterally.  Nose: Patent. No rhinorrhea.  Throat: Clear, no erythema or drainge.  Lungs: Clear to auscultation bilaterally. No wheezes, rhonchi, or rales.  Heart: Regular rate and rhythm. No murmurs.  Abdomen: Soft, non-tender, nondistended. BS active.  Extremities: Intact x 4. Trace LE edema. Gait stable.  Neuro: CN 2-12 grossly intact.   Psych: Mood and affect appropriate and congruent.    Laboratory:  COMPLETE BLOOD COUNT   Lab Results   Component Value Date    WBC 6.9 05/13/2024    HGB 15.8 (H) 05/13/2024    HCT 45.6 (H) 05/13/2024    PLTCNT 237 05/13/2024       DIFFERENTIAL  Lab Results   Component Value Date    PMNS 58 05/13/2024    LYMPHOCYTES 28 05/13/2024     MONOCYTES 7 05/13/2024    EOSINOPHIL 5 05/13/2024    BASOPHILS 1 05/13/2024    BASOPHILS 0.10 05/13/2024    PMNABS 4.00 05/13/2024    LYMPHSABS 1.90 05/13/2024    EOSABS 0.30 05/13/2024    MONOSABS 0.50 05/13/2024     BASIC METABOLIC PANEL  Lab Results   Component Value Date    SODIUM 141 05/13/2024    POTASSIUM 4.3 05/13/2024    CHLORIDE 106 05/13/2024    CO2 27 05/13/2024    ANIONGAP 8 05/13/2024    BUN 17 05/13/2024    CREATININE 0.96 05/13/2024    BUNCRRATIO 18 05/13/2024    GFR 69 05/13/2024    CALCIUM 9.1 05/13/2024    GLUCOSENF 104 05/13/2024      THYROID  STIMULATING HORMONE  Lab Results   Component Value Date    TSH 4.163 05/13/2024        Most recent lab results reviewed with patient.    Assessment & Plan  Edema, peripheral    Orders:    furosemide  (LASIX ) 20 mg Oral Tablet; Take 1 Tablet (20 mg total) by mouth Daily for 90 days    potassium chloride  (K-DUR) 10 mEq Oral Tab Sust.Rel. Particle/Crystal; Take 1 Tablet (10 mEq total) by mouth Daily Indications: low amount of potassium in the blood    Hypothyroidism, unspecified type    Orders:    levothyroxine  (SYNTHROID) 25 mcg Oral Tablet; Take 1 Tablet (25 mcg total) by mouth Daily    THYROID  STIMULATING HORMONE (SENSITIVE TSH); Future    Anxiety    Orders:    escitalopram  oxalate (LEXAPRO ) 10 mg Oral Tablet; Take 1 Tablet (10 mg total) by mouth Daily    Iron  deficiency anemia    Orders:    FEROSUL 325 mg (65 mg iron ) Oral Tablet; Take 1 Tablet (325 mg total) by mouth Twice daily with food Indications: anemia from inadequate iron     CBC/DIFF; Future    IRON  TRANSFERRIN AND TIBC; Future    Gastroesophageal reflux disease, unspecified whether esophagitis present    Orders:    omeprazole  (PRILOSEC) 20 mg Oral Capsule, Delayed Release(E.C.); Take 1 Capsule (20 mg total) by mouth Daily for 90 days    Hypertension,  unspecified type    Orders:    metoprolol  succinate (TOPROL -XL) 25 mg Oral Tablet Sustained Release 24 hr; Take 1 Tablet (25 mg total) by mouth Daily     valsartan  (DIOVAN ) 320 mg Oral Tablet; Take 1 Tablet (320 mg total) by mouth Daily    COMPREHENSIVE METABOLIC PNL, FASTING; Future    Diarrhea, unspecified type  Increased dose to 2mg  bid prn.   Orders:    Colestipol  (COLESTID ) 1 gram Oral Tablet; Take 2 Tablets (2 g total) by mouth Twice per day as needed for up to 90 days           Counseling with regards to preventative care given. Medication summary was discussed with the patient to verify compliance and understanding. Medication safety was discussed. A good faith effort was made to reconcile the patient's medications.     The patient was given the opportunity to ask questions and those questions were answered to the patient's satisfaction. The patient was encouraged to call with any additional questions or concerns. More than 50% of the visit was spent counseling and coordinating care.    Orders Placed This Encounter    THYROID  STIMULATING HORMONE (SENSITIVE TSH)    CBC/DIFF    COMPREHENSIVE METABOLIC PNL, FASTING    IRON  TRANSFERRIN AND TIBC    CBC WITH DIFF    furosemide  (LASIX ) 20 mg Oral Tablet    levothyroxine  (SYNTHROID) 25 mcg Oral Tablet    metoprolol  succinate (TOPROL -XL) 25 mg Oral Tablet Sustained Release 24 hr    escitalopram  oxalate (LEXAPRO ) 10 mg Oral Tablet    FEROSUL 325 mg (65 mg iron ) Oral Tablet    omeprazole  (PRILOSEC) 20 mg Oral Capsule, Delayed Release(E.C.)    potassium chloride  (K-DUR) 10 mEq Oral Tab Sust.Rel. Particle/Crystal    valsartan  (DIOVAN ) 320 mg Oral Tablet    Colestipol  (COLESTID ) 1 gram Oral Tablet       Depression screening is negative. PHQ 2 Total: 0              Return in about 3 months (around 11/08/2024).  Derrek Patricia, PA-C 08/10/2024, 08:57         [1]   Allergies  Allergen Reactions    Augmentin [Amoxicillin-Pot Clavulanate]     Clindamycin     Quinolones      Other reaction(s): Chest Pain

## 2024-08-10 NOTE — Assessment & Plan Note (Addendum)
 Orders:    omeprazole (PRILOSEC) 20 mg Oral Capsule, Delayed Release(E.C.); Take 1 Capsule (20 mg total) by mouth Daily for 90 days

## 2024-08-10 NOTE — Nursing Note (Signed)
 08/10/24 0811   Depression Screen   Little interest or pleasure in doing things. 0   Feeling down, depressed, or hopeless 0   PHQ 2 Total 0

## 2024-08-10 NOTE — Assessment & Plan Note (Addendum)
 Orders:    levothyroxine (SYNTHROID) 25 mcg Oral Tablet; Take 1 Tablet (25 mcg total) by mouth Daily    THYROID  STIMULATING HORMONE (SENSITIVE TSH); Future

## 2024-11-10 ENCOUNTER — Ambulatory Visit (INDEPENDENT_AMBULATORY_CARE_PROVIDER_SITE_OTHER): Payer: Self-pay | Admitting: PHYSICIAN ASSISTANT

## 2024-11-16 ENCOUNTER — Ambulatory Visit (INDEPENDENT_AMBULATORY_CARE_PROVIDER_SITE_OTHER): Admitting: PHYSICIAN ASSISTANT
